# Patient Record
Sex: Female | Born: 2000 | Race: Black or African American | Hispanic: No | Marital: Single
Health system: Midwestern US, Community
[De-identification: ages and names within clinical notes are randomized; demographics above are authoritative.]

## PROBLEM LIST (undated history)

## (undated) DIAGNOSIS — R233 Spontaneous ecchymoses: Secondary | ICD-10-CM

## (undated) DIAGNOSIS — F319 Bipolar disorder, unspecified: Secondary | ICD-10-CM

## (undated) HISTORY — PX: WISDOM TOOTH EXTRACTION: SHX21

---
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
## 2019-05-08 ENCOUNTER — Ambulatory Visit: Admit: 2019-05-08 | Payer: PRIVATE HEALTH INSURANCE | Attending: Family Medicine | Primary: Pediatrics

## 2019-05-08 DIAGNOSIS — N760 Acute vaginitis: Secondary | ICD-10-CM

## 2019-05-08 LAB — POCT URINALYSIS DIPSTICK W/O MICROSCOPE (AUTO)
Bilirubin, UA: NEGATIVE
Blood, UA POC: NEGATIVE
Glucose, UA POC: NEGATIVE
Ketones, UA: 5
Leukocytes, UA: 70
Nitrite, UA: NEGATIVE
Protein, UA POC: 15
Spec Grav, UA: 1.025
Urobilinogen, UA: 0.2
pH, UA: 6

## 2019-05-08 LAB — POC PREGNANCY UR-QUAL: HCG, Urine, POC: NEGATIVE

## 2019-05-08 MED ORDER — FLUCONAZOLE 150 MG PO TABS
150 MG | ORAL_TABLET | Freq: Once | ORAL | 1 refills | Status: AC
Start: 2019-05-08 — End: 2019-05-08

## 2019-05-08 MED ORDER — NYSTATIN-TRIAMCINOLONE 100000-0.1 UNIT/GM-% EX CREA
100000-0.1- UNIT/GM-% | CUTANEOUS | 0 refills | Status: AC
Start: 2019-05-08 — End: ?
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
## 2019-05-08 NOTE — Patient Instructions (Addendum)
Patient Education        Vaginitis: Care Instructions  Your Care Instructions     Vaginitis is soreness or infection of the vagina. This common problem can cause itching and burning. And it can cause a change in vaginal discharge. Sometimes it can cause pain during sex. Vaginitis may be caused by bacteria, yeast, or other germs. Some infections that cause it are caught from a sexual partner. Bath products, spermicides, and douches can irritate the vagina too.  Some women have this problem during and after menopause. A drop in estrogen levels during this time can cause dryness, soreness, and pain during sex.  Your doctor can give you medicine to treat an infection. And home care may help you feel better. For certain types of infections, your sex partner must be treated too.  Follow-up care is a key part of your treatment and safety. Be sure to make and go to all appointments, and call your doctor if you are having problems. It's also a good idea to know your test results and keep a list of the medicines you take.  How can you care for yourself at home?   If your doctor prescribed antibiotics, take them as directed. Do not stop taking them just because you feel better. You need to take the full course of antibiotics.   Take your medicines exactly as prescribed. Call your doctor if you think you are having a problem with your medicine.   Do not eat or drink anything that has alcohol if you are taking metronidazole (Flagyl).   If you have a yeast infection, use over-the-counter products as your doctor tells you to. Or take medicine your doctor prescribes exactly as directed.   Wash your vaginal area daily with water. You also can use a mild, unscented soap if you want.   Do not use scented bath products. And do not use vaginal sprays or douches.   Put a washcloth soaked in cool water on the area to relieve itching. Or you can take cool baths.   If you have dryness because of menopause, use estrogen cream or  pills that your doctor prescribes.   Ask your doctor about when it is okay to have sex.   Use a personal lubricant before sex if you have dryness. Examples are Astroglide, K-Y Jelly, and Wet Lubricant Gel.   Ask your doctor if your sex partner also needs treatment.  When should you call for help?   Call your doctor now or seek immediate medical care if:   You have a fever and pelvic pain.  Watch closely for changes in your health, and be sure to contact your doctor if:   You have bleeding other than your period.   You do not get better as expected.  Where can you learn more?  Go to [email or web address] and sign in to your MyChart account. Enter [phone] in the [place] box to learn more about "Vaginitis: Care Instructions."     If you do not have an account, please click on the "Sign Up Now" link.  Current as of: November 8, 2019Content Version: 12.5   2006-2020 Healthwise, Incorporated.   Care instructions adapted under license by [facility]. If you have questions about a medical condition or this instruction, always ask your healthcare professional. [place] any warranty or liability for your use of this information.         Patient Education        Vaginal Yeast  Infection: Care Instructions  Your Care Instructions    A vaginal yeast infection is caused by too many yeast cells in the vagina. This is common in women of all ages. Itching, vaginal discharge and irritation, and other symptoms can bother you. But yeast infections don't often cause other health problems.  Some medicines can increase your risk of getting a yeast infection. These include antibiotics, birth control pills, hormones, and steroids. You may also be more likely to get a yeast infection if you are pregnant, have diabetes, douche, or wear tight clothes.  With treatment, most yeast infections get better in 2 to 3 days.  Follow-up care is a key part of your treatment  and safety. Be sure to make and go to all appointments, and call your doctor if you are having problems. It's also a good idea to know your test results and keep a list of the medicines you take.  How can you care for yourself at home?   Take your medicines exactly as prescribed. Call your doctor if you think you are having a problem with your medicine.   Ask your doctor about over-the-counter (OTC) medicines for yeast infections. They may cost less than prescription medicines. If you use an OTC treatment, read and follow all instructions on the label.   Do not use tampons while using a vaginal cream or suppository. The tampons can absorb the medicine. Use pads instead.   Wear loose cotton clothing. Do not wear nylon or other fabric that holds body heat and moisture close to the skin.   Try sleeping without underwear.   Do not scratch. Relieve itching with a cold pack or a cool bath.   Do not wash your vaginal area more than once a day. Use plain water or a mild, unscented soap. Air-dry the vaginal area.   Change out of wet swimsuits after swimming.   Do not have sex until you have finished your treatment.   Do not douche.  When should you call for help?   Call your doctor now or seek immediate medical care if:   You have unexpected vaginal bleeding.   You have new or increased pain in your vagina or pelvis.  Watch closely for changes in your health, and be sure to contact your doctor if:   You have a fever.   You are not getting better after 2 days.   Your symptoms come back after you finish your medicines.  Where can you learn more?  Go to [email or web address] and sign in to your MyChart account. Enter [phone] in the Search Health Information box to learn more about "Vaginal Yeast Infection: Care Instructions."     If you do not have an account, please click on the "Sign Up Now" link.  Current as of: November 8, 2019Content Version: 12.5   2006-2020 Healthwise,  Incorporated.   Care instructions adapted under license by [facility]. If you have questions about a medical condition or this instruction, always ask your healthcare professional. Healthwise, Incorporated disclaims any warranty or liability for your use of this information.

## 2019-05-09 LAB — C. TRACHOMATIS / N. GONORRHOEAE, DNA
C. trachomatis DNA: NOT DETECTED
NEISSERIA GONORRHOEAE, DNA: NOT DETECTED

## 2019-05-09 LAB — VAGINAL PATHOGENS DNA PANEL
Bacterial Vaginosis Markers: NEGATIVE
Candida glabrata: NEGATIVE
Candida spp: POSITIVE — AB
Trichomonas vaginalis: NEGATIVE

## 2019-05-09 NOTE — Telephone Encounter (Signed)
NM left , Busy tone.

## 2019-05-09 NOTE — Telephone Encounter (Signed)
-----   Message from Dwight. Dan Humphreys, APRN - CNP sent at 05/09/2019  9:46 AM EDT -----  Please notify pt that she is negative for bacterial vaginosis, trichomonas, gonorrhea, and chlamydia. Pt was positive for yeast which is not sexually transmitted. She was prescribed treatment for this at her visit yesterday, take as directed.

## 2019-05-11 NOTE — Telephone Encounter (Signed)
I contacted patient and relayed result message. Verbalized understanding of results.

## 2019-12-01 ENCOUNTER — Other Ambulatory Visit: Payer: Self-pay | Admitting: Registered Nurse

## 2019-12-01 DIAGNOSIS — R1084 Generalized abdominal pain: Secondary | ICD-10-CM

## 2019-12-08 ENCOUNTER — Other Ambulatory Visit: Payer: Self-pay

## 2019-12-08 ENCOUNTER — Other Ambulatory Visit: Payer: Self-pay | Admitting: Family Medicine

## 2019-12-15 ENCOUNTER — Ambulatory Visit
Admission: RE | Admit: 2019-12-15 | Discharge: 2019-12-15 | Disposition: A | Discharge status: Home / Self Care | Payer: PRIVATE HEALTH INSURANCE | Source: Ambulatory Visit | Attending: Nurse Practitioner | Admitting: Nurse Practitioner

## 2019-12-15 DIAGNOSIS — R1084 Generalized abdominal pain: Secondary | ICD-10-CM

## 2020-06-04 ENCOUNTER — Emergency Department (HOSPITAL_COMMUNITY): Payer: PRIVATE HEALTH INSURANCE

## 2020-06-04 ENCOUNTER — Other Ambulatory Visit: Payer: Self-pay

## 2020-06-04 ENCOUNTER — Emergency Department (HOSPITAL_COMMUNITY)
Admission: EM | Admit: 2020-06-04 | Discharge: 2020-06-04 | Disposition: A | Discharge status: Home / Self Care | Payer: PRIVATE HEALTH INSURANCE | Attending: Emergency Medicine | Admitting: Emergency Medicine

## 2020-06-04 DIAGNOSIS — Y929 Unspecified place or not applicable: Secondary | ICD-10-CM | POA: Insufficient documentation

## 2020-06-04 DIAGNOSIS — Y999 Unspecified external cause status: Secondary | ICD-10-CM | POA: Diagnosis not present

## 2020-06-04 DIAGNOSIS — S99912A Unspecified injury of left ankle, initial encounter: Secondary | ICD-10-CM | POA: Insufficient documentation

## 2020-06-04 DIAGNOSIS — Y939 Activity, unspecified: Secondary | ICD-10-CM | POA: Insufficient documentation

## 2020-06-04 DIAGNOSIS — S9002XA Contusion of left ankle, initial encounter: Secondary | ICD-10-CM

## 2020-06-04 MED ORDER — IBUPROFEN 200 MG PO TABS
600.0000 mg | ORAL_TABLET | Freq: Once | ORAL | Status: AC
  Administered 2020-06-04: 600 mg via ORAL
  Filled 2020-06-04: qty 3
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
## 2020-06-04 NOTE — Discharge Instructions (Signed)
Take ibuprofen 600 mg every 6 hours as needed for pain.  Elevate your ankle as much as possible for the next several days.  Ice for 20 minutes every 2 hours while awake for the next 2 days.  Follow-up with primary doctor if symptoms are not improving in the next week.

## 2020-06-09 NOTE — ED Notes (Signed)
Pt called inquiring about a 2 week work note. Explained she needs to f/u with her PCP if she feels she needs longer than a day or two.

## 2020-11-14 ENCOUNTER — Other Ambulatory Visit: Payer: Self-pay

## 2020-11-14 ENCOUNTER — Emergency Department (HOSPITAL_COMMUNITY)
Admission: EM | Admit: 2020-11-14 | Discharge: 2020-11-15 | Disposition: A | Discharge status: Home / Self Care | Payer: 59 | Attending: Emergency Medicine | Admitting: Emergency Medicine

## 2020-11-14 DIAGNOSIS — R45851 Suicidal ideations: Secondary | ICD-10-CM | POA: Insufficient documentation

## 2020-11-14 DIAGNOSIS — Z046 Encounter for general psychiatric examination, requested by authority: Secondary | ICD-10-CM | POA: Diagnosis present

## 2020-11-14 DIAGNOSIS — Z20822 Contact with and (suspected) exposure to covid-19: Secondary | ICD-10-CM | POA: Diagnosis not present
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
## 2020-11-14 NOTE — ED Notes (Signed)
Pt belongings taken and placed in Triage nursing station Pt cabinet

## 2020-11-15 LAB — CBC WITH DIFFERENTIAL/PLATELET
Abs Immature Granulocytes: 0.18 10*3/uL — ABNORMAL HIGH (ref 0.00–0.07)
Basophils Absolute: 0.1 10*3/uL (ref 0.0–0.1)
Basophils Relative: 1 %
Eosinophils Absolute: 0.1 10*3/uL (ref 0.0–0.5)
Eosinophils Relative: 1 %
HCT: 41.9 % (ref 36.0–46.0)
Hemoglobin: 14.2 g/dL (ref 12.0–15.0)
Immature Granulocytes: 1 %
Lymphocytes Relative: 17 %
Lymphs Abs: 2.2 10*3/uL (ref 0.7–4.0)
MCH: 31.2 pg (ref 26.0–34.0)
MCHC: 33.9 g/dL (ref 30.0–36.0)
MCV: 92.1 fL (ref 80.0–100.0)
Monocytes Absolute: 0.9 10*3/uL (ref 0.1–1.0)
Monocytes Relative: 7 %
Neutro Abs: 10 10*3/uL — ABNORMAL HIGH (ref 1.7–7.7)
Neutrophils Relative %: 73 %
Platelets: 284 10*3/uL (ref 150–400)
RBC: 4.55 MIL/uL (ref 3.87–5.11)
RDW: 13.8 % (ref 11.5–15.5)
WBC: 13.4 10*3/uL — ABNORMAL HIGH (ref 4.0–10.5)
nRBC: 0 % (ref 0.0–0.2)

## 2020-11-15 LAB — COMPREHENSIVE METABOLIC PANEL
ALT: 12 U/L (ref 0–44)
AST: 20 U/L (ref 15–41)
Albumin: 4.4 g/dL (ref 3.5–5.0)
Alkaline Phosphatase: 59 U/L (ref 38–126)
Anion gap: 10 (ref 5–15)
BUN: 9 mg/dL (ref 6–20)
CO2: 20 mmol/L — ABNORMAL LOW (ref 22–32)
Calcium: 9.1 mg/dL (ref 8.9–10.3)
Chloride: 106 mmol/L (ref 98–111)
Creatinine, Ser: 0.87 mg/dL (ref 0.44–1.00)
GFR, Estimated: >60 mL/min (ref >60)
Glucose, Bld: 91 mg/dL (ref 70–99)
Potassium: 3.8 mmol/L (ref 3.5–5.1)
Sodium: 136 mmol/L (ref 135–145)
Total Bilirubin: 1 mg/dL (ref 0.3–1.2)
Total Protein: 7.8 g/dL (ref 6.5–8.1)

## 2020-11-15 LAB — RESP PANEL BY RT-PCR (FLU A&B, COVID) ARPGX2
Influenza A by PCR: NEGATIVE
Influenza B by PCR: NEGATIVE
SARS Coronavirus 2 by RT PCR: NEGATIVE

## 2020-11-15 LAB — SALICYLATE LEVEL: Salicylate Lvl: <7 mg/dL — ABNORMAL LOW (ref 7.0–30.0)

## 2020-11-15 LAB — RAPID URINE DRUG SCREEN, HOSP PERFORMED
Amphetamines: NOT DETECTED
Barbiturates: NOT DETECTED
Benzodiazepines: NOT DETECTED
Cocaine: NOT DETECTED
Opiates: NOT DETECTED
Tetrahydrocannabinol: POSITIVE — AB

## 2020-11-15 LAB — ETHANOL: Alcohol, Ethyl (B): <10 mg/dL (ref <10)

## 2020-11-15 LAB — ACETAMINOPHEN LEVEL: Acetaminophen (Tylenol), Serum: <10 ug/mL — ABNORMAL LOW (ref 10–30)

## 2020-11-15 LAB — I-STAT BETA HCG BLOOD, ED (MC, WL, AP ONLY): I-stat hCG, quantitative: <5 m[IU]/mL (ref <5)

## 2020-11-15 NOTE — Discharge Instructions (Signed)
Follow-up with counseling services at school.  Also attached list of local clinics if needed.  Return here for any new/acute changes-- worsening depression, suicidal thoughts, etc.

## 2020-11-15 NOTE — ED Provider Notes (Cosign Needed Addendum)
[facility]-EMERGENCY DEPT Provider Note   CSN: [id number] Arrival date & time: 11/14/20  2223     History Chief Complaint  Patient presents with  . Suicidal    Carolyn York is a 20 y.o. female.  The history is provided by the patient and medical records.    20 y.o. F here with suicidal ideation.  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  States she has a lot of activities that takes up a lot of time.  States today she and roommate got into an argument which is not typical for them.  States ultimately the police were called by 2 girls that live above them who overheard their argument.  Patient voiced at one point that she wished "I just wasn't here anymore" because she got overwhelmed and did not want to be in that situation any longer.  States she has no specific plan of self-harm.  Denies any homicidal ideation.  No hallucinations.  Denies any alcohol or substance abuse.  She has no psychiatric history, states she did see a counselor years ago while she was ""going through some stuff" with her family.  She never required antidepressants or other psychiatric medications.  No past medical history on file.  There are no problems to display for this patient.    OB History   No obstetric history on file.     No family history on file.     Home Medications Prior to Admission medications   Not on File    Allergies    Other  Review of Systems   Review of Systems  Psychiatric/Behavioral: Positive for suicidal ideas.  All other systems reviewed and are negative.   Physical Exam Updated Vital Signs BP (!) 142/98 (BP Location: Left Arm)   Pulse (!) 107   Temp 98.4 F (36.9 C) (Oral)   Resp 17   Ht 5\' 1"  (1.549 m)   Wt 42.6 kg   LMP 10/28/2020 (Within Days)   SpO2 100%   BMI 17.76 kg/m   Physical Exam Vitals and nursing note reviewed.  Constitutional:      Appearance: She is  well-developed and well-nourished.  HENT:     Head: Normocephalic and atraumatic.     Mouth/Throat:     Mouth: Oropharynx is clear and moist.  Eyes:     Extraocular Movements: EOM normal.     Conjunctiva/sclera: Conjunctivae normal.     Pupils: Pupils are equal, round, and reactive to light.  Cardiovascular:     Rate and Rhythm: Normal rate and regular rhythm.     Heart sounds: Normal heart sounds.  Pulmonary:     Effort: Pulmonary effort is normal.     Breath sounds: Normal breath sounds.  Abdominal:     General: Bowel sounds are normal.     Palpations: Abdomen is soft.  Musculoskeletal:        General: Normal range of motion.     Cervical back: Normal range of motion.  Skin:    General: Skin is warm and dry.  Neurological:     Mental Status: She is alert and oriented to person, place, and time.  Psychiatric:        Mood and Affect: Mood and affect normal.     Comments: Calm, cooperative, good insight Passive SI without plan Denies HI/AVH     ED Results / Procedures / Treatments   Labs (all labs ordered are  listed, but only abnormal results are displayed) Labs Reviewed  CBC WITH DIFFERENTIAL/PLATELET - Abnormal; Notable for the following components:      Result Value   WBC 13.4 (*)    Neutro Abs 10.0 (*)    Abs Immature Granulocytes 0.18 (*)    All other components within normal limits  COMPREHENSIVE METABOLIC PANEL - Abnormal; Notable for the following components:   CO2 20 (*)    All other components within normal limits  RAPID URINE DRUG SCREEN, HOSP PERFORMED - Abnormal; Notable for the following components:   Tetrahydrocannabinol POSITIVE (*)    All other components within normal limits  SALICYLATE LEVEL - Abnormal; Notable for the following components:   Salicylate Lvl <7.0 (*)    All other components within normal limits  ACETAMINOPHEN LEVEL - Abnormal; Notable for the following components:   Acetaminophen (Tylenol), Serum <10 (*)    All other components  within normal limits  RESP PANEL BY RT-PCR (FLU A&B, COVID) [id number]  ETHANOL  I-STAT BETA HCG BLOOD, ED ([facility], [facility], [facility] ONLY)    EKG None  Radiology No results found.  Procedures Procedures   Medications Ordered in ED Medications - No data to display  ED Course  I have reviewed the triage vital signs and the nursing notes.  Pertinent labs & imaging results that were available during my care of the patient were reviewed by me and considered in my medical decision making (see chart for details).    MDM Rules/Calculators/A&P  20 year old female presenting to the ED with suicidal thoughts.  This stemmed after an argument with her roommate.  States she feels like she mostly just got overwhelmed.  She does report some recent stressors with school in [place].  She is awake, alert, appropriately oriented here.  Vital signs are stable.  Denies any physical complaints.  Does admit to some suicidal thoughts but denies any active plan.  Denies any homicidal ideation or hallucinations.  No significant alcohol or illicit drug use.  Does seem to have good insight into her current situation.  Labs reassuring, UDS is positive for THC.  Covid screen negative.  Medically clear.  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  She was also given resource guide.  Encouraged to return here for any new/acute changes.  Final Clinical Impression(s) / ED Diagnoses Final diagnoses:  Suicidal thoughts    Rx / DC Orders ED Discharge Orders    None       Garlon Hatchet, PA-C 11/15/20 0512    Garlon Hatchet, PA-C 11/15/20 0533    Nira Conn, MD 11/16/20 [phone]

## 2020-11-15 NOTE — BH Assessment (Signed)
Patient gave consent/permission to contact mother for additional information and to share update. Marzetta Merino, mother, [phone]

## 2020-11-15 NOTE — ED Notes (Signed)
TTS assessment in progress. 

## 2020-11-15 NOTE — BH Assessment (Signed)
Nira Conn, NP, psych cleared. Patient will follow up with the NP at [facility] where she is currently receiving counseling. Patient contracts for safety. Additional outpatient resources provided.

## 2020-11-15 NOTE — BH Assessment (Addendum)
Comprehensive Clinical Assessment (CCA) Note  11/15/2020 Carolyn York [id number]   Carolyn York is a 20 year old female presenting voluntarily to [facility] due to SI with no plan. Patient denies HI, psychosis and alcohol/drug usage. Patient was BIB police after a verbal altercation with roommate tonight. Police were called by 2 girls that live above them who overheard their argument.  Patient voiced at one point that she wished "I just wasn't here anymore" because she got overwhelmed and did not want to be in that situation any longer. Patient reported argument which is not typical for them.  Patient reported onset of SI with no plan for several months. 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 Patient reported being involved with to many activities that take up a lot of her time.   Patient denied prior inpatient psych treatment, suicide attempts and self-harming behaviors. Patient is currently seeing a counselor at school to deal with "going through family stuff". Patient denied being on any prescribed psych medications.   Patient is currently a sophomore at [employer]. Patient reported making "okay grades" last year and better grades currently. Patient reported living on campus with roommate. Patient reported being close with her parents and 8 year old sister. Patient denied access to guns. Patient was anxious and cooperative during assessment.   Patient gave consent/permission to contact mother for additional information and to share update. Alexzandria Massman, mother, [phone] Mother shared concern that patient had an episode this past summer where she was having SI with no plan. Mother did not share concern that daughter would harm herself. Mother requesting to speak with daughter. TTS clinician shared [facility] contact information to mother. Mother agrees with plan for daughter to follow [facility] for additional outpatient psych services.  Nira Conn, NP, psych cleared. Patient will follow up with the NP at [facility] where she is currently receiving counseling. Patient contracts for safety. Additional outpatient resources provided.   Chief Complaint:  Chief Complaint  Patient presents with  . Suicidal   Visit Diagnosis: Major depressive disorder  CCA Biopsychosocial Intake/Chief Complaint:  SI with no plan  Current Symptoms/Problems: Feelings of being overwhelmed  Patient Reported Schizophrenia/Schizoaffective Diagnosis in Past: No  Strengths: self-awareness  Preferences: good grades in college  Abilities: n/a  Type of Services Patient Feels are Needed: "not sure"  Initial Clinical Notes/Concerns: No data recorded  Mental Health Symptoms Depression:  Difficulty Concentrating   Duration of Depressive symptoms: Greater than two weeks   Mania:  None   Anxiety:   Restlessness   Psychosis:  None   Duration of Psychotic symptoms: No data recorded  Trauma:  None   Obsessions:  None   Compulsions:  None   Inattention:  None   Hyperactivity/Impulsivity:  N/A   Oppositional/Defiant Behaviors:  None   Emotional Irregularity:  No data recorded  Other Mood/Personality Symptoms:  No data recorded   Mental Status Exam Appearance and self-care  Stature:  Average   Weight:  Average weight   Clothing:  Age-appropriate   Grooming:  Normal   Cosmetic use:  Age appropriate   Posture/gait:  Normal   Motor activity:  Restless   Sensorium  Attention:  Distractible   Concentration:  Anxiety interferes   Orientation:  X5   Recall/memory:  Normal   Affect and Mood  Affect:  Anxious   Mood:  Anxious   Relating  Eye contact:  Normal   Facial expression:  Anxious   Attitude toward examiner:  Cooperative   Thought and Language  Speech flow: Clear and Coherent   Thought content:  Appropriate to Mood and  Circumstances   Preoccupation:  None   Hallucinations:  None   Organization:  No data recorded  [employer] of Knowledge:  Average   Intelligence:  Average   Abstraction:  Normal   Judgement:  Fair   Reality Testing:  No data recorded  Insight:  Poor   Decision Making:  Impulsive; Confused   Social Functioning  Social Maturity:  No data recorded  Social Judgement:  Normal   Stress  Stressors:  School   Coping Ability:  Overwhelmed   Skill Deficits:  Self-control; Responsibility   Supports:  Family     Religion:   Leisure/Recreation:  Exercise/Diet: Exercise/Diet Do You Have Any Trouble Sleeping?: Yes Explanation of Sleeping Difficulties: 4-5  CCA Employment/Education Employment/Work Situation: Employment / Work Situation Employment situation: Nurse, children's: Education Is Patient Currently Attending School?: Yes School Currently Attending: [employer] Last Grade Completed: 14 Did Garment/textile technologist From [employer]?: Yes 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 Did You Have Any Special Interests In School?: n/a Did You Have An Individualized Education Program (IIEP): No Did You Have Any Difficulty At School?: No Patient's Education Has Been Impacted by Current Illness: No  CCA Family/Childhood History Family and Relationship History:   Childhood History:  Childhood History Additional childhood history information: uta Description of patient's relationship with caregiver when they were a child: uta Patient's description of current relationship with people who raised him/her: uta How were you disciplined when you got in trouble as a child/adolescent?: uta Does patient have siblings?: Yes Number of Siblings: 1 Description of patient's current relationship with siblings: good Did patient suffer any verbal/emotional/physical/sexual abuse as a child?: No Did patient suffer from severe  childhood neglect?: No Has patient ever been sexually abused/assaulted/raped as an adolescent or adult?: No Was the patient ever a victim of a crime or a disaster?: No Witnessed domestic violence?: No Has patient been affected by domestic violence as an adult?: No  Child/Adolescent Assessment:   CCA Substance Use Alcohol/Drug Use: Alcohol / Drug Use Pain Medications: see MAR Prescriptions: see MAR Over the Counter: see MAR History of alcohol / drug use?: No history of alcohol / drug abuse   ASAM's:  Six Dimensions of Multidimensional Assessment  Dimension 1:  Acute Intoxication and/or Withdrawal Potential:      Dimension 2:  Biomedical Conditions and Complications:      Dimension 3:  Emotional, Behavioral, or Cognitive Conditions and Complications:     Dimension 4:  Readiness to Change:     Dimension 5:  Relapse, Continued use, or Continued Problem Potential:     Dimension 6:  Recovery/Living Environment:     ASAM Severity Score:    ASAM Recommended Level of Treatment:     Substance use Disorder (SUD)   Recommendations for Services/Supports/Treatments:   DSM5 Diagnoses: There are no problems to display for this patient.  Patient Centered Plan: Patient is on the following Treatment Plan(s):    Referrals to Alternative Service(s): Referred to Alternative Service(s):   Place:   Date:   Time:    Referred to Alternative Service(s):   Place:   Date:   Time:    Referred to Alternative Service(s):   Place:   Date:   Time:  Referred to Alternative Service(s):   Place:   Date:   Time:     Venora Maples, [facility]

## 2020-12-21 ENCOUNTER — Ambulatory Visit: Payer: PRIVATE HEALTH INSURANCE | Admitting: Internal Medicine

## 2021-09-17 ENCOUNTER — Emergency Department (HOSPITAL_COMMUNITY): Payer: PRIVATE HEALTH INSURANCE

## 2021-09-17 ENCOUNTER — Encounter (HOSPITAL_COMMUNITY): Payer: Self-pay | Admitting: Emergency Medicine

## 2021-09-17 ENCOUNTER — Other Ambulatory Visit: Payer: Self-pay

## 2021-09-17 ENCOUNTER — Emergency Department (HOSPITAL_COMMUNITY)
Admission: EM | Admit: 2021-09-17 | Discharge: 2021-09-17 | Disposition: A | Discharge status: Home / Self Care | Payer: PRIVATE HEALTH INSURANCE | Attending: Emergency Medicine | Admitting: Emergency Medicine

## 2021-09-17 DIAGNOSIS — S01112A Laceration without foreign body of left eyelid and periocular area, initial encounter: Secondary | ICD-10-CM | POA: Insufficient documentation

## 2021-09-17 DIAGNOSIS — S0993XA Unspecified injury of face, initial encounter: Secondary | ICD-10-CM | POA: Diagnosis present

## 2021-09-17 DIAGNOSIS — F10229 Alcohol dependence with intoxication, unspecified: Secondary | ICD-10-CM | POA: Diagnosis not present

## 2021-09-17 DIAGNOSIS — Y908 Blood alcohol level of 240 mg/100 ml or more: Secondary | ICD-10-CM | POA: Insufficient documentation

## 2021-09-17 DIAGNOSIS — S01111A Laceration without foreign body of right eyelid and periocular area, initial encounter: Secondary | ICD-10-CM | POA: Diagnosis not present

## 2021-09-17 DIAGNOSIS — S0181XA Laceration without foreign body of other part of head, initial encounter: Secondary | ICD-10-CM | POA: Insufficient documentation

## 2021-09-17 DIAGNOSIS — F10929 Alcohol use, unspecified with intoxication, unspecified: Secondary | ICD-10-CM

## 2021-09-17 DIAGNOSIS — W19XXXA Unspecified fall, initial encounter: Secondary | ICD-10-CM | POA: Insufficient documentation

## 2021-09-17 LAB — CBC WITH DIFFERENTIAL/PLATELET
Abs Immature Granulocytes: 0.05 10*3/uL (ref 0.00–0.07)
Basophils Absolute: 0.1 10*3/uL (ref 0.0–0.1)
Basophils Relative: 1 %
Eosinophils Absolute: 0.2 10*3/uL (ref 0.0–0.5)
Eosinophils Relative: 2 %
HCT: 47.6 % — ABNORMAL HIGH (ref 36.0–46.0)
Hemoglobin: 16.2 g/dL — ABNORMAL HIGH (ref 12.0–15.0)
Immature Granulocytes: 1 %
Lymphocytes Relative: 38 %
Lymphs Abs: 3.4 10*3/uL (ref 0.7–4.0)
MCH: 31.6 pg (ref 26.0–34.0)
MCHC: 34 g/dL (ref 30.0–36.0)
MCV: 92.8 fL (ref 80.0–100.0)
Monocytes Absolute: 0.3 10*3/uL (ref 0.1–1.0)
Monocytes Relative: 4 %
Neutro Abs: 4.9 10*3/uL (ref 1.7–7.7)
Neutrophils Relative %: 54 %
Platelets: 335 10*3/uL (ref 150–400)
RBC: 5.13 MIL/uL — ABNORMAL HIGH (ref 3.87–5.11)
RDW: 13 % (ref 11.5–15.5)
WBC: 8.9 10*3/uL (ref 4.0–10.5)
nRBC: 0 % (ref 0.0–0.2)

## 2021-09-17 LAB — COMPREHENSIVE METABOLIC PANEL
ALT: 13 U/L (ref 0–44)
AST: 20 U/L (ref 15–41)
Albumin: 4.6 g/dL (ref 3.5–5.0)
Alkaline Phosphatase: 53 U/L (ref 38–126)
Anion gap: 15 (ref 5–15)
BUN: 12 mg/dL (ref 6–20)
CO2: 22 mmol/L (ref 22–32)
Calcium: 9.2 mg/dL (ref 8.9–10.3)
Chloride: 105 mmol/L (ref 98–111)
Creatinine, Ser: 0.69 mg/dL (ref 0.44–1.00)
GFR, Estimated: >60 mL/min (ref >60)
Glucose, Bld: 92 mg/dL (ref 70–99)
Potassium: 4 mmol/L (ref 3.5–5.1)
Sodium: 142 mmol/L (ref 135–145)
Total Bilirubin: 0.6 mg/dL (ref 0.3–1.2)
Total Protein: 8.4 g/dL — ABNORMAL HIGH (ref 6.5–8.1)

## 2021-09-17 LAB — ETHANOL: Alcohol, Ethyl (B): 372 mg/dL (ref <10)

## 2021-09-17 LAB — I-STAT BETA HCG BLOOD, ED (MC, WL, AP ONLY): I-stat hCG, quantitative: <5 m[IU]/mL (ref <5)

## 2021-09-17 MED ORDER — LACTATED RINGERS IV BOLUS
1000.0000 mL | Freq: Once | INTRAVENOUS | Status: AC
  Administered 2021-09-17: 1000 mL via INTRAVENOUS

## 2021-09-17 NOTE — Discharge Instructions (Addendum)
Please remove the Dermabond from your laceration in 7 days.  If you develop any redness, swelling, discharge from the wound, fevers, nausea/vomiting, please come back to the emergency department.  I have attached information on alcohol cessation as well as local rehabilitation centers.  If you develop any new or worsening symptoms please come back to the emergency department.

## 2021-09-17 NOTE — ED Provider Notes (Signed)
[place] DEPT Provider Note   CSN: [id number] Arrival date & time: 09/17/21  1452     History Chief Complaint  Patient presents with   Alcohol Intoxication   Fall   Laceration    Carolyn York is a 20 y.o. female.  HPI  Patient is a 20 year old female who presents to the emergency department due to a fall that occurred prior to arrival.  Per EMS, patient told EMS that she drank 1/8 of a handle of bourbon today.  Patient told me "she had a couple of shots".  She states that she typically drinks on a daily basis.  She states she was walking, fell forwards, and struck her forehead on a metal chair.  Denies LOC but states that she also cannot remember many of the details regarding the event.  Denies any nausea, vomiting, numbness, weakness.  Denies any other regions of pain.  Denies any neck or back pain.  Denies SI, HI, visual/auditory hallucinations.     History reviewed. No pertinent past medical history.  There are no problems to display for this patient.   History reviewed. No pertinent surgical history.   OB History   No obstetric history on file.     History reviewed. No pertinent family history.     Home Medications Prior to Admission medications   Not on File    Allergies    Other  Review of Systems   Review of Systems  All other systems reviewed and are negative. Ten systems reviewed and are negative for acute change, except as noted in the HPI.   Physical Exam Updated Vital Signs BP (!) 139/100 (BP Location: Right Arm)   Pulse 81   Temp 97.6 F (36.4 C) (Oral)   Resp 18   SpO2 100%   Physical Exam Vitals and nursing note reviewed.  Constitutional:      General: She is not in acute distress.    Appearance: Normal appearance. She is not ill-appearing, toxic-appearing or diaphoretic.  HENT:     Head: Normocephalic.     Comments: 1 cm well approximated laceration with no active bleeding noted between the  eyebrows.    Right Ear: External ear normal.     Left Ear: External ear normal.     Nose: Nose normal.     Mouth/Throat:     Pharynx: Oropharynx is clear.  Eyes:     General: No scleral icterus.       Right eye: No discharge.        Left eye: No discharge.     Extraocular Movements: Extraocular movements intact.     Conjunctiva/sclera: Conjunctivae normal.  Neck:     Comments: No midline C, T, or L-spine tenderness.  No step-offs, crepitus, or deformities. Cardiovascular:     Rate and Rhythm: Normal rate and regular rhythm.     Pulses: Normal pulses.     Heart sounds: Normal heart sounds. No murmur heard.   No friction rub. No gallop.  Pulmonary:     Effort: Pulmonary effort is normal. No respiratory distress.     Breath sounds: Normal breath sounds. No stridor. No wheezing, rhonchi or rales.  Abdominal:     General: Abdomen is flat.     Palpations: Abdomen is soft.     Tenderness: There is no abdominal tenderness.  Musculoskeletal:        General: Normal range of motion.     Cervical back: Normal range of motion and neck supple. No  tenderness.  Skin:    General: Skin is warm and dry.  Neurological:     General: No focal deficit present.     Mental Status: She is alert and oriented to person, place, and time.     Comments: Patient slurring her words.  Otherwise A&O x3.  Answering questions appropriately.  Moving all 4 extremities with ease.  No gross deficits.  Psychiatric:        Mood and Affect: Mood normal.        Speech: Speech is delayed and slurred.        Behavior: Behavior normal.   ED Results / Procedures / Treatments   Labs (all labs ordered are listed, but only abnormal results are displayed) Labs Reviewed  COMPREHENSIVE METABOLIC PANEL - Abnormal; Notable for the following components:      Result Value   Total Protein 8.4 (*)    All other components within normal limits  CBC WITH DIFFERENTIAL/PLATELET - Abnormal; Notable for the following components:   RBC  5.13 (*)    Hemoglobin 16.2 (*)    HCT 47.6 (*)    All other components within normal limits  ETHANOL - Abnormal; Notable for the following components:   Alcohol, Ethyl (B) 372 (*)    All other components within normal limits  RAPID URINE DRUG SCREEN, HOSP PERFORMED  I-STAT BETA HCG BLOOD, ED ([facility], [facility], [facility] ONLY)    EKG None  Radiology CT HEAD WO CONTRAST ( )  Result Date: 09/17/2021 CLINICAL DATA:  Laceration fall EXAM: CT HEAD WITHOUT CONTRAST CT CERVICAL SPINE WITHOUT CONTRAST TECHNIQUE: Multidetector CT imaging of the head and cervical spine was performed following the standard protocol without intravenous contrast. Multiplanar CT image reconstructions of the cervical spine were also generated. COMPARISON:  None. FINDINGS: CT HEAD FINDINGS Brain: No evidence of acute infarction, hemorrhage, hydrocephalus, extra-axial collection or mass lesion/mass effect. Vascular: No hyperdense vessel or unexpected calcification. Skull: Normal. Negative for fracture or focal lesion. Sinuses/Orbits: No acute finding. Other: Small forehead laceration. CT CERVICAL SPINE FINDINGS Alignment: Mild reversal of cervical lordosis. No subluxation. Facet alignment within normal limits Skull base and vertebrae: No acute fracture. No primary bone lesion or focal pathologic process. Soft tissues and spinal canal: No prevertebral fluid or swelling. No visible canal hematoma. Disc levels:  Within normal limits Upper chest: Negative. Other: None IMPRESSION: 1. Negative non contrasted CT appearance of the brain. Small forehead laceration 2. Reversal of cervical lordosis.  No acute osseous abnormality. Electronically Signed   By: Jasmine Pang M.D.   On: 09/17/2021 16:38   CT Cervical Spine Wo Contrast  Result Date: 09/17/2021 CLINICAL DATA:  Laceration fall EXAM: CT HEAD WITHOUT CONTRAST CT CERVICAL SPINE WITHOUT CONTRAST TECHNIQUE: Multidetector CT imaging of the head and cervical spine was performed following the standard  protocol without intravenous contrast. Multiplanar CT image reconstructions of the cervical spine were also generated. COMPARISON:  None. FINDINGS: CT HEAD FINDINGS Brain: No evidence of acute infarction, hemorrhage, hydrocephalus, extra-axial collection or mass lesion/mass effect. Vascular: No hyperdense vessel or unexpected calcification. Skull: Normal. Negative for fracture or focal lesion. Sinuses/Orbits: No acute finding. Other: Small forehead laceration. CT CERVICAL SPINE FINDINGS Alignment: Mild reversal of cervical lordosis. No subluxation. Facet alignment within normal limits Skull base and vertebrae: No acute fracture. No primary bone lesion or focal pathologic process. Soft tissues and spinal canal: No prevertebral fluid or swelling. No visible canal hematoma. Disc levels:  Within normal limits Upper chest: Negative. Other: None IMPRESSION: 1.  Negative non contrasted CT appearance of the brain. Small forehead laceration 2. Reversal of cervical lordosis.  No acute osseous abnormality. Electronically Signed   By: Donavan Foil M.D.   On: 09/17/2021 16:38    Procedures Procedures   Medications Ordered in ED Medications  lactated ringers bolus 1,000 mL (1,000 mLs Intravenous Bolus 09/17/21 1540)    ED Course  I have reviewed the triage vital signs and the nursing notes.  Pertinent labs & imaging results that were available during my care of the patient were reviewed by me and considered in my medical decision making (see chart for details).  Clinical Course as of 09/17/21 1646  Mon Sep 17, 2021  1629 Alcohol, Ethyl (B)(!!): 372 [LJ]    Clinical Course User Index [LJ] Rayna Sexton, PA-C   MDM Rules/Calculators/A&P                          Pt is a 20 y.o. female who presents to the emergency department due to alcohol intoxication, fall, as well as a small laceration to the forehead.  Labs: Elevated RBC 5.13, hemoglobin of 16.2, hematocrit of 47.6. CMP with a total protein of  8.4. Ethanol 372. I-STAT beta-hCG less than 5.  Imaging: CT scan of the head and neck without contrast shows a negative noncontrasted CT appearance of the brain.  Small forehead laceration.  Reversal of cervical lordosis.  No acute osseous abnormality.  I, Rayna Sexton, PA-C, personally reviewed and evaluated these images and lab results as part of my medical decision-making.  Patient appears intoxicated at this time.  Elevated RBC, hemoglobin, and hematocrit with results as noted above.  Likely hemoconcentration.  Patient given 1 L of LR.  CMP with no electrolyte derangements.  Significantly elevated ethanol of 372.  Patient A&O x3.  Answering questions clearly.  Tolerating p.o. intake.  Ambulatory with a steady gait.  Does not appear to be exhibiting signs of alcohol withdrawal at this time.  CT scan of the head and neck are reassuring.  Laceration was cleaned and Dermabond was applied by nursing staff.  Patient tolerated the procedure well.  Patient eager to be discharged at this time.  Feel that this is reasonable.  She has a ride home.  We discussed alcohol cessation at length and patient was given information on alcohol cessation.  Discussed return precautions.  Her questions were answered and she was amicable at the time of discharge.  Note: Portions of this report may have been transcribed using voice recognition software. Every effort was made to ensure accuracy; however, inadvertent computerized transcription errors may be present.   Final Clinical Impression(s) / ED Diagnoses Final diagnoses:  Alcoholic intoxication with complication [facility])  Fall, initial encounter  Facial laceration, initial encounter   Rx / DC Orders ED Discharge Orders     None        Rayna Sexton, PA-C 09/17/21 1652    Drenda Freeze, MD 09/17/21 2317

## 2021-09-17 NOTE — ED Triage Notes (Signed)
Per EMS- Patient reports that she drank Bourbon/1/8 Handle today. Patient fell and has a laceration between her eyebrows. No LOC.  Patient crying in Fast track room.

## 2021-11-08 ENCOUNTER — Other Ambulatory Visit: Payer: Self-pay | Admitting: Family Medicine

## 2021-11-08 DIAGNOSIS — R1084 Generalized abdominal pain: Secondary | ICD-10-CM

## 2021-11-09 ENCOUNTER — Other Ambulatory Visit: Payer: Self-pay | Admitting: Family Medicine

## 2021-11-09 DIAGNOSIS — R102 Pelvic and perineal pain: Secondary | ICD-10-CM

## 2021-11-13 ENCOUNTER — Other Ambulatory Visit: Payer: Self-pay

## 2021-11-13 ENCOUNTER — Emergency Department (HOSPITAL_BASED_OUTPATIENT_CLINIC_OR_DEPARTMENT_OTHER)
Admission: EM | Admit: 2021-11-13 | Discharge: 2021-11-13 | Disposition: A | Discharge status: Home / Self Care | Payer: 59 | Attending: Emergency Medicine | Admitting: Emergency Medicine

## 2021-11-13 ENCOUNTER — Encounter (HOSPITAL_BASED_OUTPATIENT_CLINIC_OR_DEPARTMENT_OTHER): Payer: Self-pay

## 2021-11-13 DIAGNOSIS — B3731 Acute candidiasis of vulva and vagina: Secondary | ICD-10-CM | POA: Insufficient documentation

## 2021-11-13 DIAGNOSIS — N898 Other specified noninflammatory disorders of vagina: Secondary | ICD-10-CM | POA: Diagnosis present

## 2021-11-13 LAB — COMPREHENSIVE METABOLIC PANEL
ALT: 11 U/L (ref 0–44)
AST: 22 U/L (ref 15–41)
Albumin: 4.5 g/dL (ref 3.5–5.0)
Alkaline Phosphatase: 37 U/L — ABNORMAL LOW (ref 38–126)
Anion gap: 10 (ref 5–15)
BUN: 11 mg/dL (ref 6–20)
CO2: 25 mmol/L (ref 22–32)
Calcium: 9.6 mg/dL (ref 8.9–10.3)
Chloride: 100 mmol/L (ref 98–111)
Creatinine, Ser: 0.78 mg/dL (ref 0.44–1.00)
GFR, Estimated: >60 mL/min (ref >60)
Glucose, Bld: 91 mg/dL (ref 70–99)
Potassium: 3.4 mmol/L — ABNORMAL LOW (ref 3.5–5.1)
Sodium: 135 mmol/L (ref 135–145)
Total Bilirubin: 0.8 mg/dL (ref 0.3–1.2)
Total Protein: 7.5 g/dL (ref 6.5–8.1)

## 2021-11-13 LAB — URINALYSIS, ROUTINE W REFLEX MICROSCOPIC
Bilirubin Urine: NEGATIVE
Glucose, UA: NEGATIVE mg/dL
Hgb urine dipstick: NEGATIVE
Ketones, ur: NEGATIVE mg/dL
Nitrite: NEGATIVE
Protein, ur: 30 mg/dL — AB
Specific Gravity, Urine: 1.031 — ABNORMAL HIGH (ref 1.005–1.030)
WBC, UA: >50 WBC/hpf — ABNORMAL HIGH (ref 0–5)
pH: 8 (ref 5.0–8.0)

## 2021-11-13 LAB — CBC
HCT: 37.8 % (ref 36.0–46.0)
Hemoglobin: 12.9 g/dL (ref 12.0–15.0)
MCH: 31 pg (ref 26.0–34.0)
MCHC: 34.1 g/dL (ref 30.0–36.0)
MCV: 90.9 fL (ref 80.0–100.0)
Platelets: 256 10*3/uL (ref 150–400)
RBC: 4.16 MIL/uL (ref 3.87–5.11)
RDW: 12.3 % (ref 11.5–15.5)
WBC: 10 10*3/uL (ref 4.0–10.5)
nRBC: 0 % (ref 0.0–0.2)

## 2021-11-13 LAB — WET PREP, GENITAL
Clue Cells Wet Prep HPF POC: NONE SEEN
Sperm: NONE SEEN
Trich, Wet Prep: NONE SEEN
WBC, Wet Prep HPF POC: >=10 — AB (ref <10)

## 2021-11-13 LAB — LIPASE, BLOOD: Lipase: 13 U/L (ref 11–51)

## 2021-11-13 LAB — PREGNANCY, URINE: Preg Test, Ur: NEGATIVE

## 2021-11-13 MED ORDER — NAPROXEN 250 MG PO TABS
500.0000 mg | ORAL_TABLET | Freq: Once | ORAL | Status: AC
  Administered 2021-11-13: 500 mg via ORAL
  Filled 2021-11-13: qty 2

## 2021-11-13 MED ORDER — FLUCONAZOLE 150 MG PO TABS: ORAL_TABLET | ORAL | 0 refills | Status: DC

## 2021-11-13 NOTE — ED Triage Notes (Signed)
Complains with pelvic and vaginal pain with itching and discharge.

## 2021-11-13 NOTE — ED Provider Notes (Signed)
[facility]ERGENCY [facility] Emergency Department Provider Note MRN:  [id number]  Arrival date & time: 11/13/21     Chief Complaint   Vaginal itching History of Present Illness   Carolyn York is a 21 y.o. year-old female with no pertinent past medical history presenting to the ED with chief complaint of vaginal itching.  Left upper quadrant abdominal pain for 2 weeks.  Vaginal pain, irritation, itching, and discharge for 2 days.  Could not sleep.  No fever, no nausea or vomiting.  Mild diarrhea.  Review of Systems  A thorough review of systems was obtained and all systems are negative except as noted in the HPI and PMH.   Patient's Health History   History reviewed. No pertinent past medical history.  History reviewed. No pertinent surgical history.  No family history on file.  Social History   Socioeconomic History   Marital status: Single    Spouse name: Not on file   Number of children: Not on file   Years of education: Not on file   Highest education level: Not on file  Occupational History   Not on file  Tobacco Use   Smoking status: Never   Smokeless tobacco: Not on file  Substance and Sexual Activity   Alcohol use: Yes   Drug use: Not Currently   Sexual activity: Not on file  Other Topics Concern   Not on file  Social History Narrative   Not on file   Social Determinants of Health   Financial Resource Strain: Not on file  Food Insecurity: Not on file  Transportation Needs: Not on file  Physical Activity: Not on file  Stress: Not on file  Social Connections: Not on file  Intimate Partner Violence: Not on file     Physical Exam   Vitals:   11/13/21 0440  BP: (!) 147/100  Pulse: 88  Resp: 16  Temp: 97.9 F (36.6 C)  SpO2: 95%    CONSTITUTIONAL: Well-appearing, NAD NEURO/PSYCH:  Alert and oriented x 3, no focal deficits EYES:  eyes equal and reactive ENT/NECK:  no LAD, no JVD CARDIO: Regular rate, well-perfused, normal S1 and S2 PULM:   CTAB no wheezing or rhonchi GI/GU:  non-distended, non-tender MSK/SPINE:  No gross deformities, no edema SKIN:  no rash, atraumatic   *Additional and/or pertinent findings included in MDM below  Diagnostic and Interventional Summary    EKG Interpretation  Date/Time:    Ventricular Rate:    PR Interval:    QRS Duration:   QT Interval:    QTC Calculation:   R Axis:     Text Interpretation:         Labs Reviewed  WET PREP, GENITAL - Abnormal; Notable for the following components:      Result Value   Yeast Wet Prep HPF POC PRESENT (*)    WBC, Wet Prep HPF POC >=10 (*)    All other components within normal limits  URINALYSIS, ROUTINE W REFLEX MICROSCOPIC - Abnormal; Notable for the following components:   APPearance HAZY (*)    Specific Gravity, Urine 1.031 (*)    Protein, ur 30 (*)    Leukocytes,Ua LARGE (*)    WBC, UA >50 (*)    Bacteria, UA RARE (*)    All other components within normal limits  COMPREHENSIVE METABOLIC PANEL - Abnormal; Notable for the following components:   Potassium 3.4 (*)    Alkaline Phosphatase 37 (*)    All other components within normal limits  PREGNANCY,  URINE  CBC  LIPASE, BLOOD  GC/CHLAMYDIA PROBE AMP ([place]) NOT AT [facility]    No orders to display    Medications  naproxen (NAPROSYN) tablet 500 mg (500 mg Oral Given 11/13/21 0516)     Procedures  /  Critical Care Procedures  ED Course and Medical Decision Making  Initial Impression and Ddx DDx includes yeast infection, BV, STD, UTI, less likely TOA.  Pelvic exam revealing thick white discharge in the vaginal vault.  Patient appears uncomfortable, has some tenderness on abdominal palpation.  Awaiting labs, wet prep.  Past medical/surgical history that increases complexity of ED encounter: None  Interpretation of Diagnostics Wet prep positive for yeast.  Urinalysis appears contaminated, suspect WBCs related to yeast infection.  Labs are reassuring with no leukocytosis.  Patient  Reassessment and Ultimate Disposition/Management Patient is comfortable appearing on reassessment, no indication for further testing or imaging at this time given the improved exam and reassuring labs, appropriate for discharge.  Patient management required discussion with the following services or consulting groups:  None  Complexity of Problems Addressed Acute complicated illness or Injury  Additional Data Reviewed and Analyzed Further history obtained from: Further history from spouse/family member  Factors Impacting ED Encounter Risk Prescriptions  Elmer Sow. Pilar Plate, MD [facility] Emergency Medicine [facility] [email or web address]  Final Clinical Impressions(s) / ED Diagnoses     ICD-10-CM   1. Vaginal yeast infection  B37.31       ED Discharge Orders          Ordered    fluconazole (DIFLUCAN) 150 MG tablet        11/13/21 0615             Discharge Instructions Discussed with and Provided to Patient:     Discharge Instructions      You were evaluated in the Emergency Department and after careful evaluation, we did not find any emergent condition requiring admission or further testing in the hospital.  Your exam/testing today was overall reassuring.  Symptoms likely due to a yeast infection.  Please take the fluconazole medication as directed and follow-up with a GYN doctor.  Use Tylenol or Motrin for discomfort.  Please return to the Emergency Department if you experience any worsening of your condition.  Thank you for allowing [place] to be a part of your care.        Sabas Sous, MD 11/13/21 [phone]

## 2021-11-13 NOTE — Discharge Instructions (Signed)
You were evaluated in the Emergency Department and after careful evaluation, we did not find any emergent condition requiring admission or further testing in the hospital.  Your exam/testing today was overall reassuring.  Symptoms likely due to a yeast infection.  Please take the fluconazole medication as directed and follow-up with a GYN doctor.  Use Tylenol or Motrin for discomfort.  Please return to the Emergency Department if you experience any worsening of your condition.  Thank you for allowing [place] to be a part of your care.

## 2021-11-13 NOTE — ED Notes (Signed)
Pt verbalizes understanding of discharge instructions. Opportunity for questioning and answers were provided. Pt discharged from ED to home.   ? ?

## 2021-11-14 LAB — GC/CHLAMYDIA PROBE AMP (~~LOC~~) NOT AT ARMC
Chlamydia: NEGATIVE
Comment: NEGATIVE
Comment: NORMAL
Neisseria Gonorrhea: NEGATIVE

## 2021-11-15 ENCOUNTER — Ambulatory Visit
Admission: RE | Admit: 2021-11-15 | Discharge: 2021-11-15 | Disposition: A | Discharge status: Home / Self Care | Payer: PRIVATE HEALTH INSURANCE | Source: Ambulatory Visit | Attending: Family Medicine | Admitting: Family Medicine

## 2021-11-15 DIAGNOSIS — R102 Pelvic and perineal pain: Secondary | ICD-10-CM

## 2021-11-15 DIAGNOSIS — R1084 Generalized abdominal pain: Secondary | ICD-10-CM

## 2021-12-27 IMAGING — CT CT HEAD W/O CM
3 series · 14 of 45 positions shown, 16 images · non-contrast
Comparison: None.

CLINICAL DATA: Laceration fall

EXAM:
CT HEAD WITHOUT CONTRAST
CT CERVICAL SPINE WITHOUT CONTRAST
TECHNIQUE: Multidetector CT imaging of the head and cervical spine was
performed following the standard protocol without intravenous
contrast. Multiplanar CT image reconstructions of the cervical spine
were also generated.

[Series 2: head wo · axial · 0.47mm/px · z∈[-186,-71]mm · 8 of 28 slices shown, 10 images]
[im 3/28  brain]
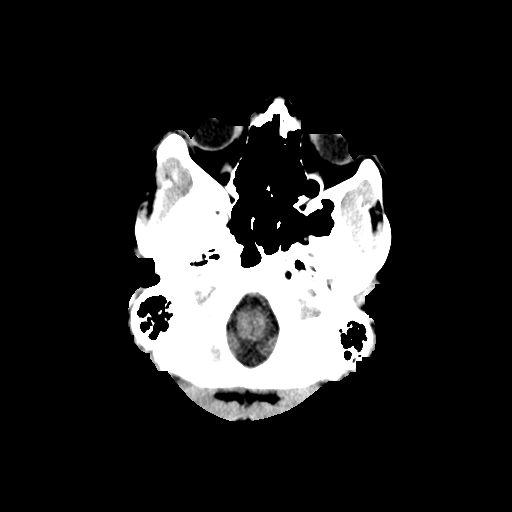
[im 3/28  bone]
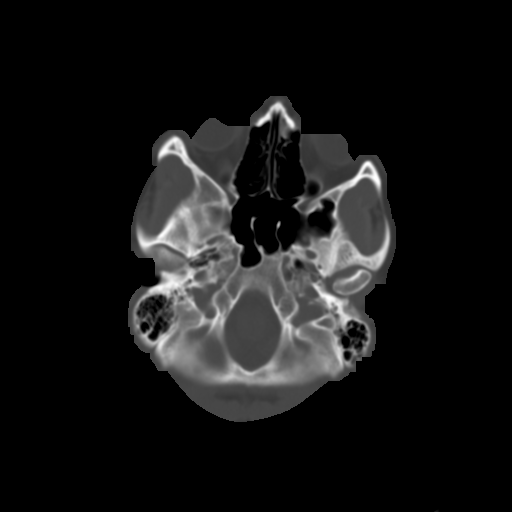
[im 6/28  brain]
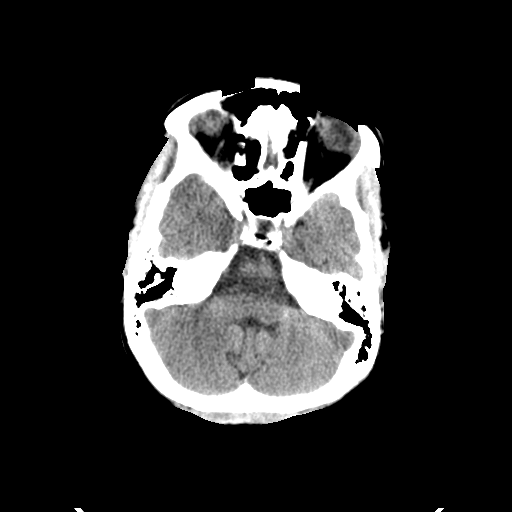
[im 10/28  brain]
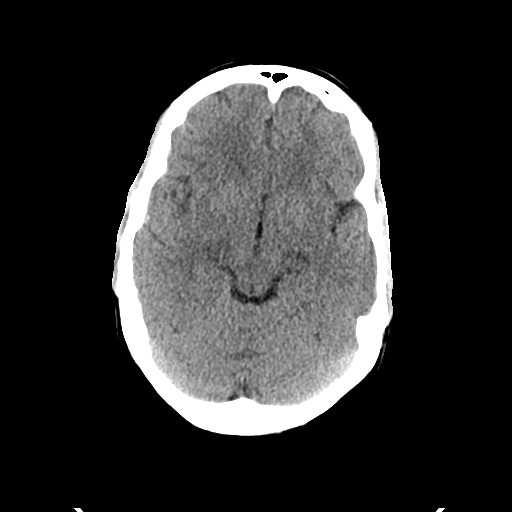
[im 13/28  brain]
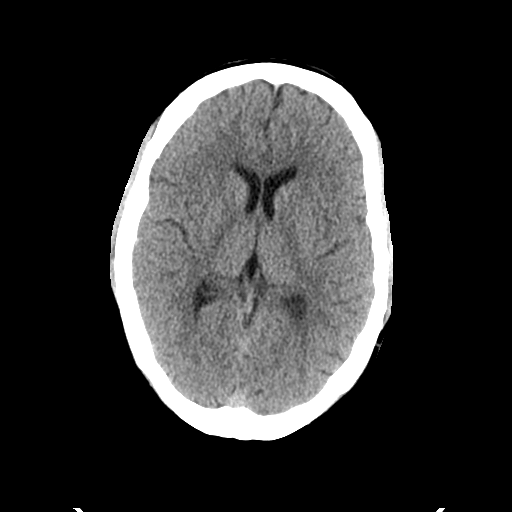
[im 16/28  brain]
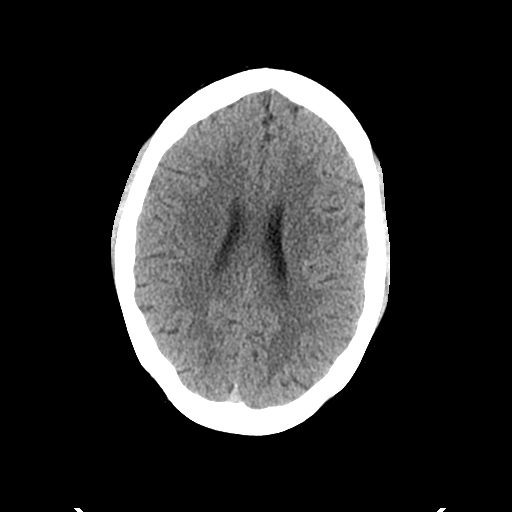
[im 16/28  bone]
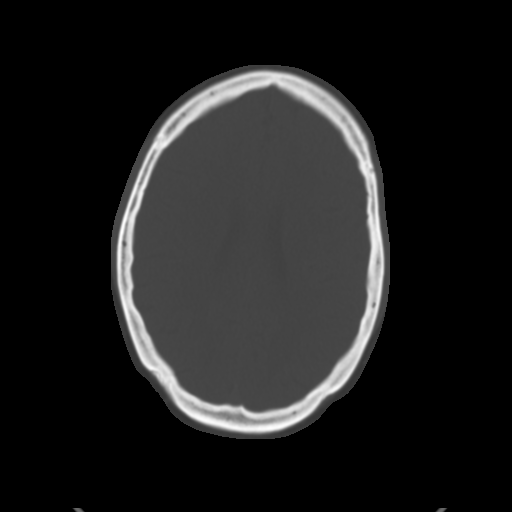
[im 19/28  brain]
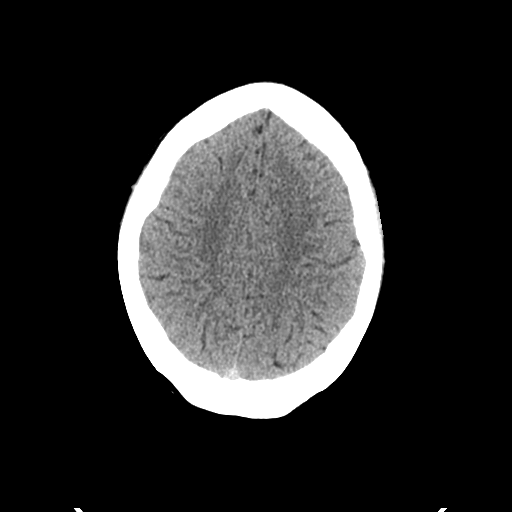
[im 23/28  brain]
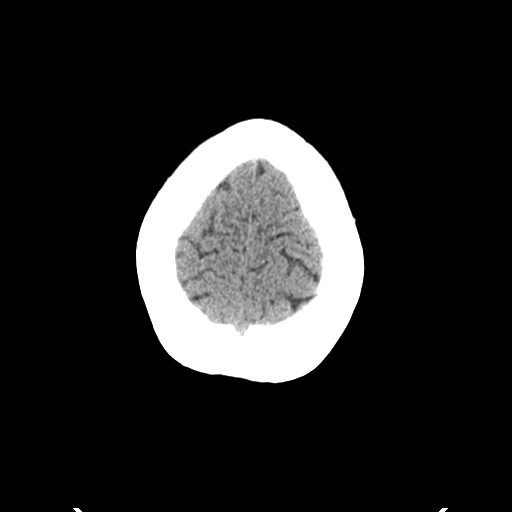
[im 26/28  brain]
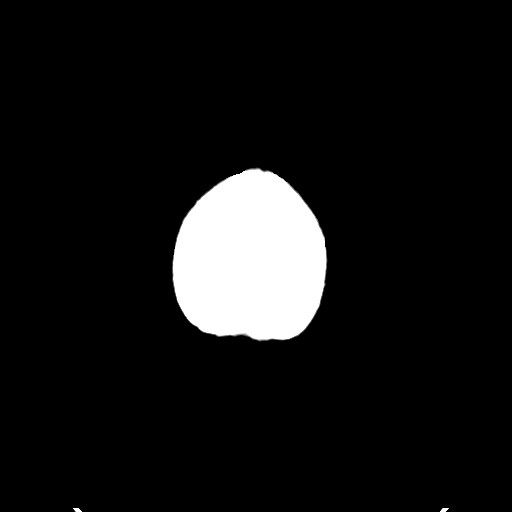

[Series 5: coronal soft tissue · coronal · 0.33mm/px · 3 of 63 slices shown]
[im 21/63  brain]
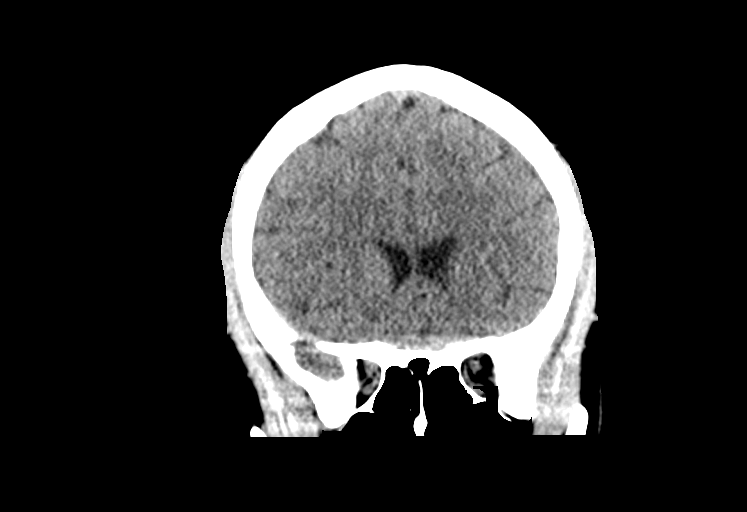
[im 28/63  brain]
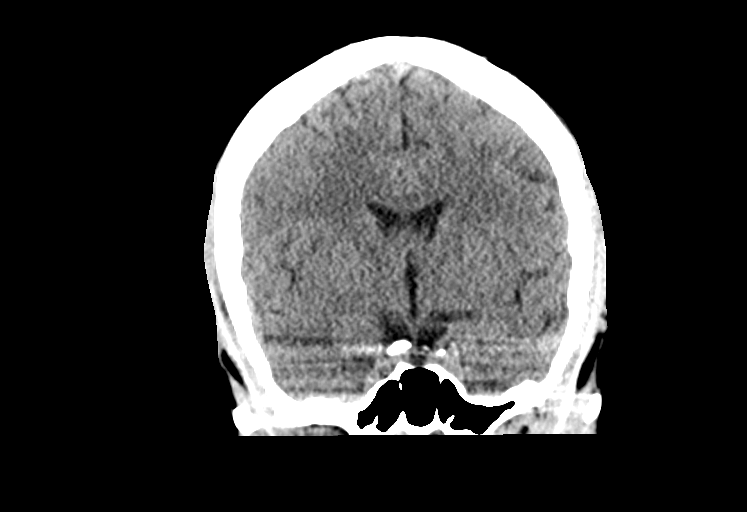
[im 35/63  brain]
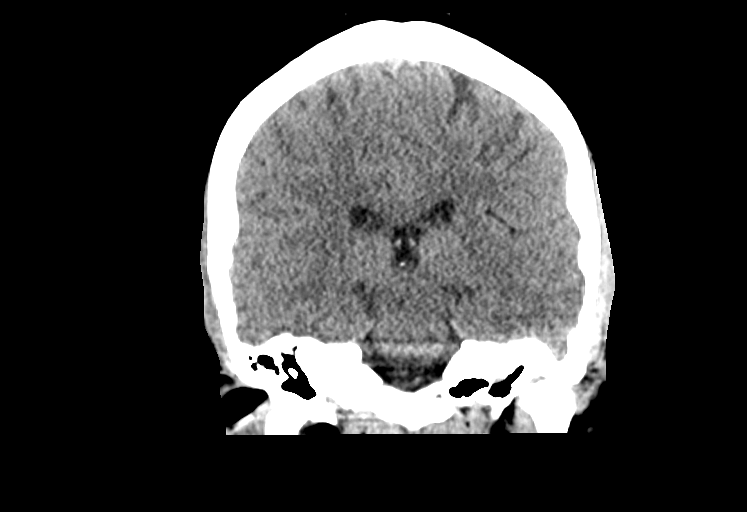

[Series 6: sagittal soft tissue · sagittal · 0.34mm/px · 3 of 45 slices shown]
[im 15/45  brain]
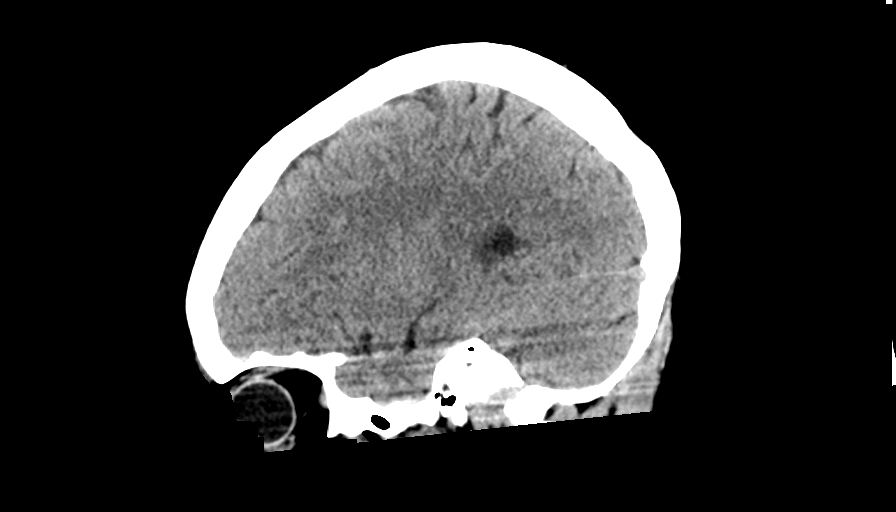
[im 23/45  brain]
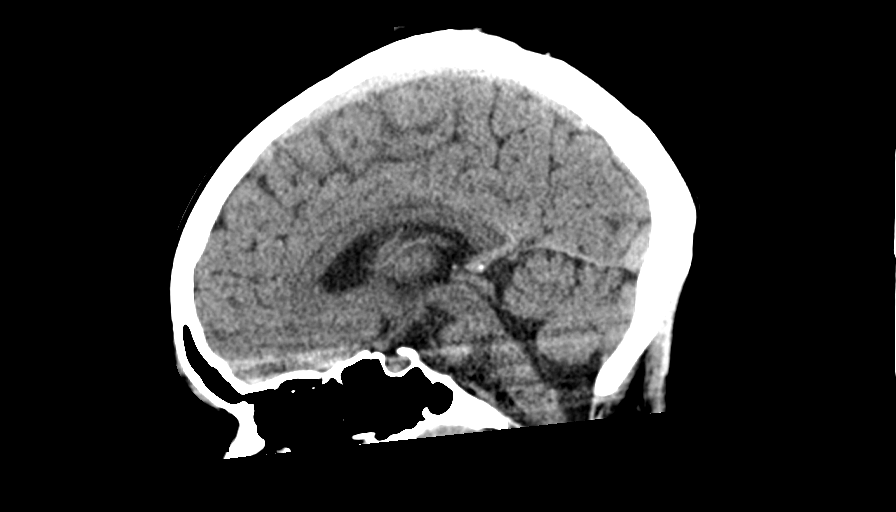
[im 30/45  brain]
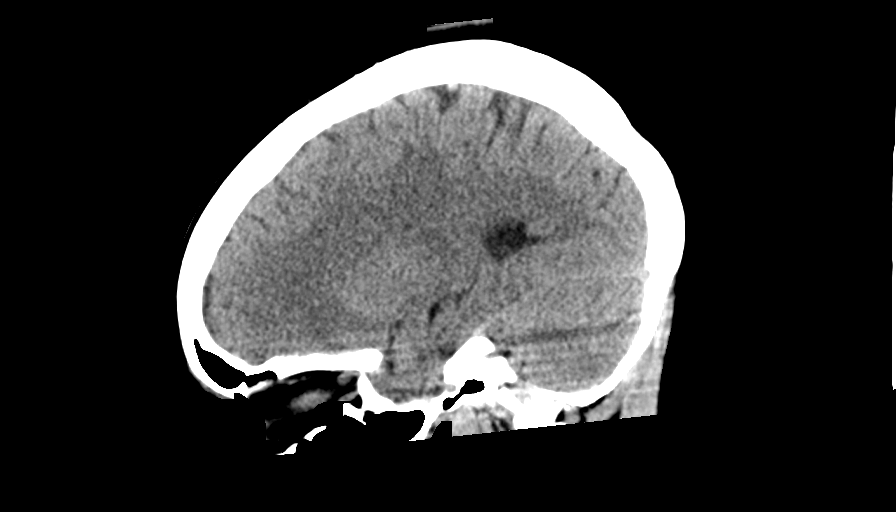

[14 of 45 positions shown; findings below may reference images not displayed]

FINDINGS: CT HEAD FINDINGS

Brain: No evidence of acute infarction, hemorrhage, hydrocephalus,
extra-axial collection or mass lesion/mass effect.

Vascular: No hyperdense vessel or unexpected calcification.

Skull: Normal. Negative for fracture or focal lesion.

Sinuses/Orbits: No acute finding.

Other: Small forehead laceration.

CT CERVICAL SPINE FINDINGS

Alignment: Mild reversal of cervical lordosis. No subluxation. Facet
alignment within normal limits

Skull base and vertebrae: No acute fracture. No primary bone lesion
or focal pathologic process.

Soft tissues and spinal canal: No prevertebral fluid or swelling. No
visible canal hematoma.

Disc levels:  Within normal limits

Upper chest: Negative.

Other: None
IMPRESSION: 1. Negative non contrasted CT appearance of the brain. Small
forehead laceration
2. Reversal of cervical lordosis.  No acute osseous abnormality.

## 2021-12-27 IMAGING — CT CT CERVICAL SPINE W/O CM
3 of 4 series · 13 of 33 positions shown, 16 images · non-contrast
Comparison: None.

CLINICAL DATA: Laceration fall

EXAM:
CT HEAD WITHOUT CONTRAST
CT CERVICAL SPINE WITHOUT CONTRAST
TECHNIQUE: Multidetector CT imaging of the head and cervical spine was
performed following the standard protocol without intravenous
contrast. Multiplanar CT image reconstructions of the cervical spine
were also generated.

[Series 4: sagittal bone · sagittal · 0.36mm/px · 5 of 38 slices shown, 6 images]
[im 13/38  bone]
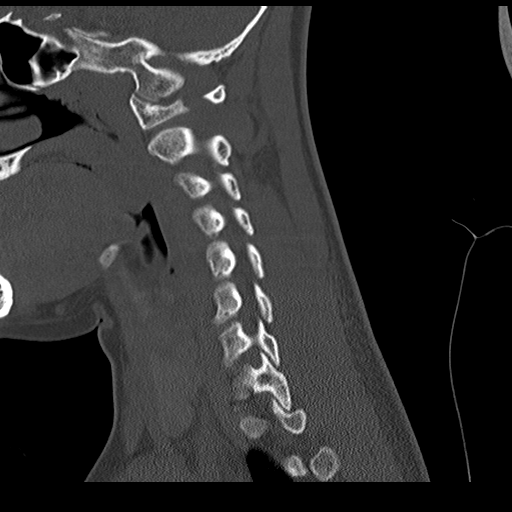
[im 16/38  bone]
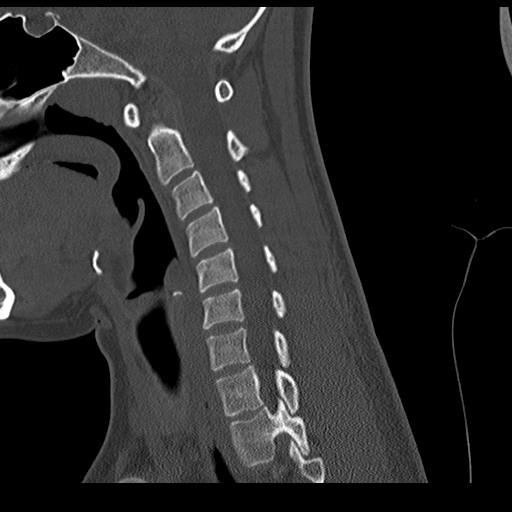
[im 19/38  soft-tissue]
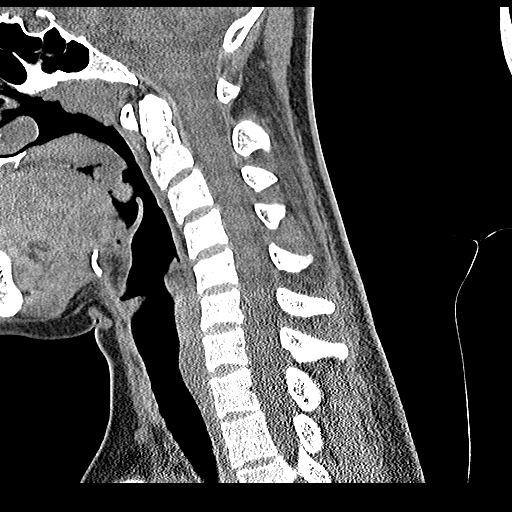
[im 19/38  bone]
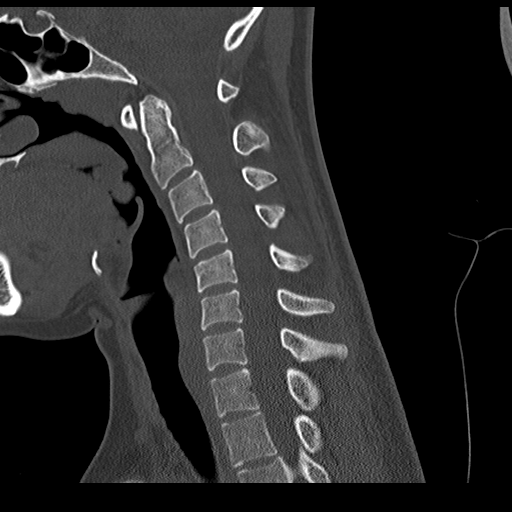
[im 22/38  bone]
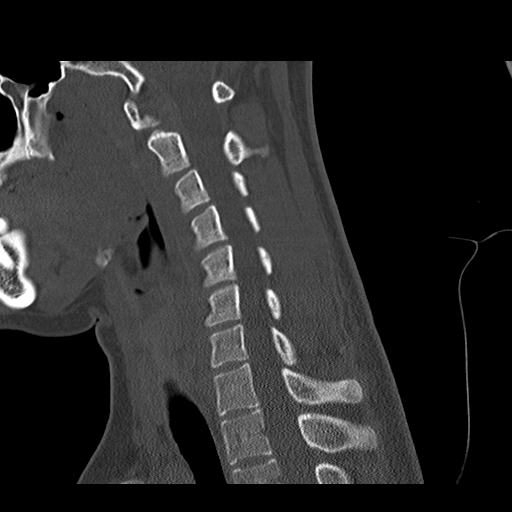
[im 25/38  bone]
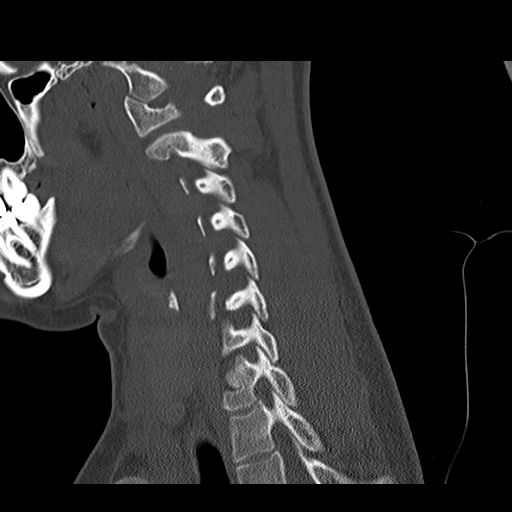

[Series 5: orthogonal bone · axial · 0.18mm/px · z∈[-311,-194]mm · 5 of 92 slices shown, 7 images]
[im 16/92  soft-tissue]
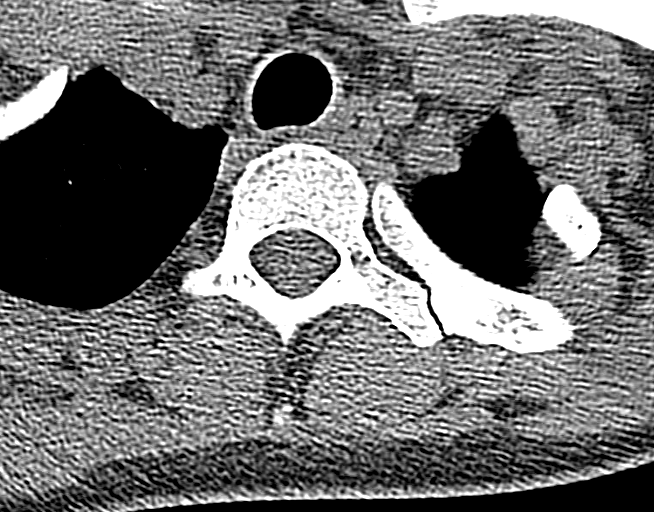
[im 16/92  bone]
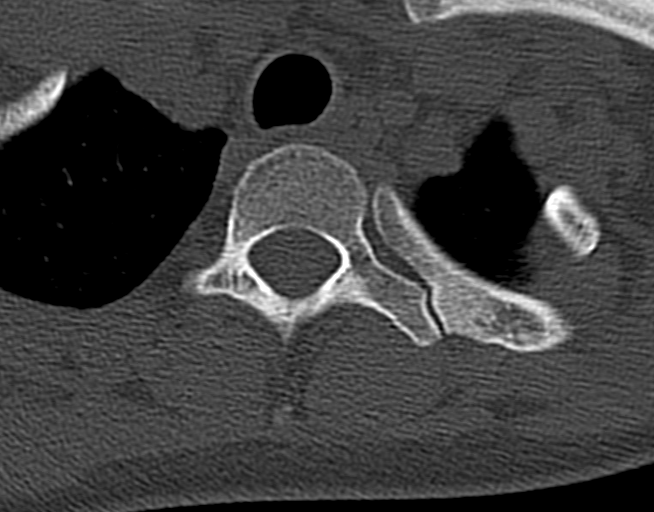
[im 31/92  bone]
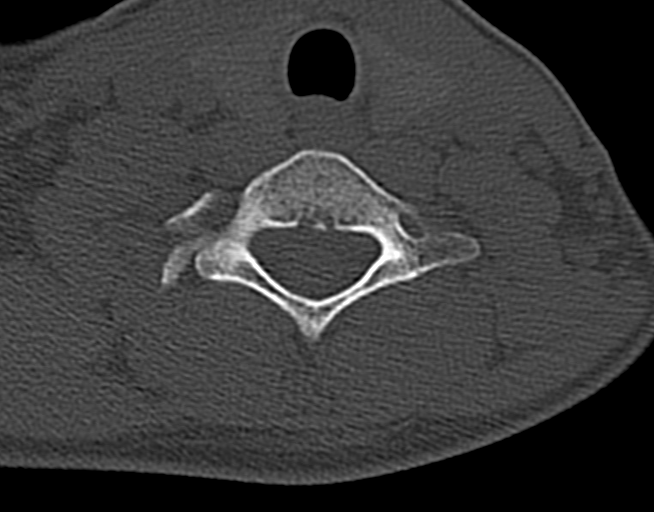
[im 46/92  bone]
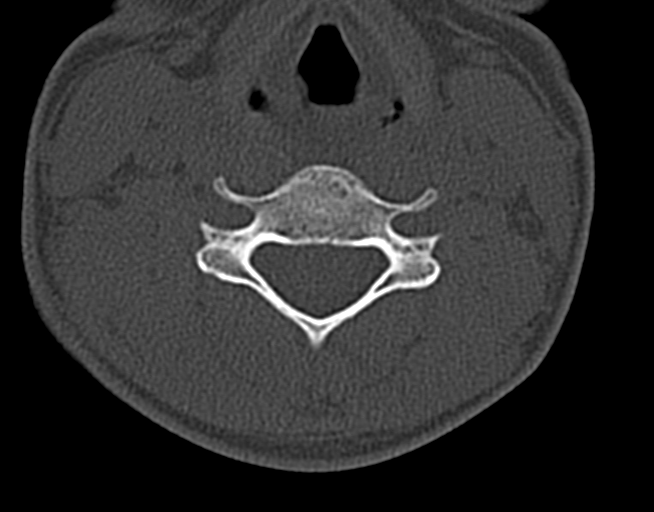
[im 61/92  bone]
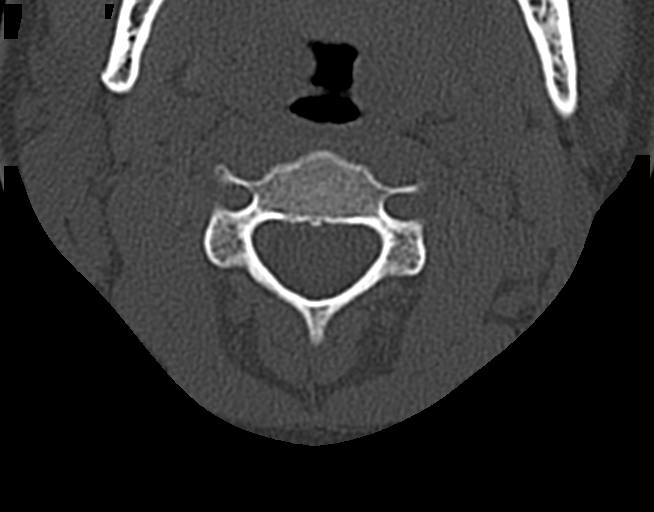
[im 76/92  soft-tissue]
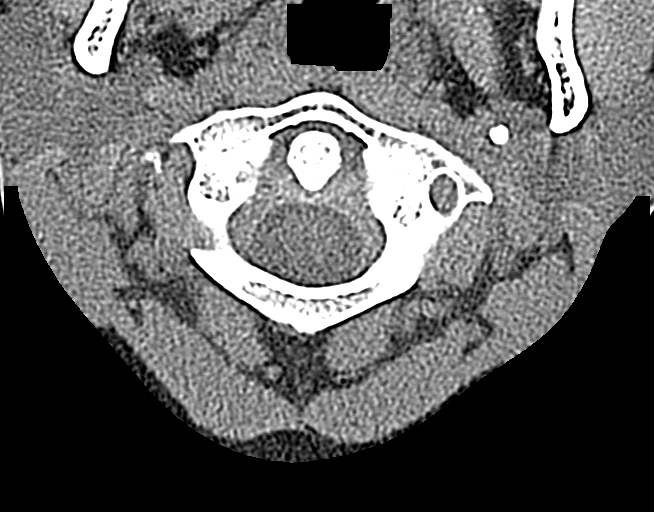
[im 76/92  bone]
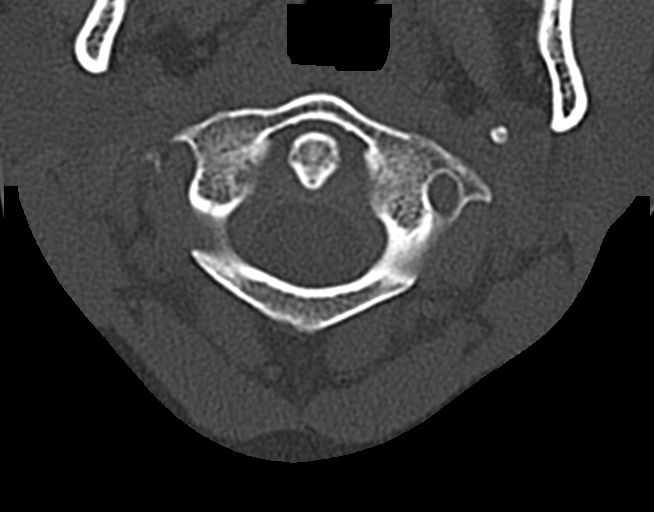

[Series 6: coronal bone · coronal · 0.26mm/px · 3 of 41 slices shown]
[im 9/41  bone]
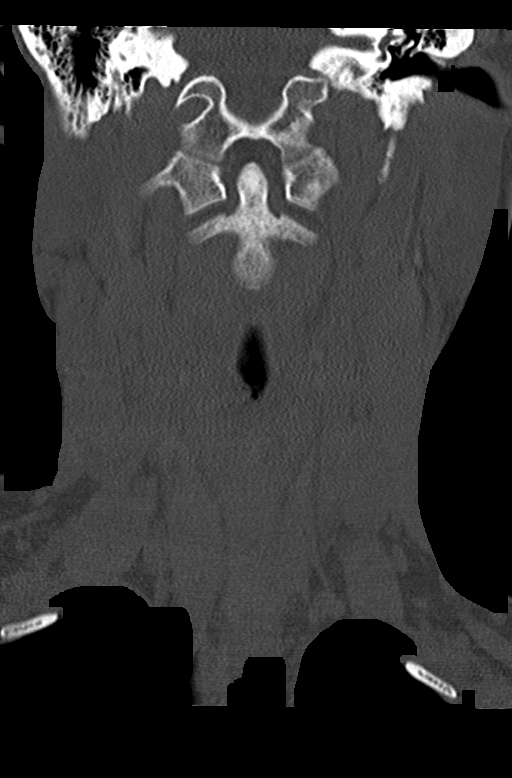
[im 17/41  bone]
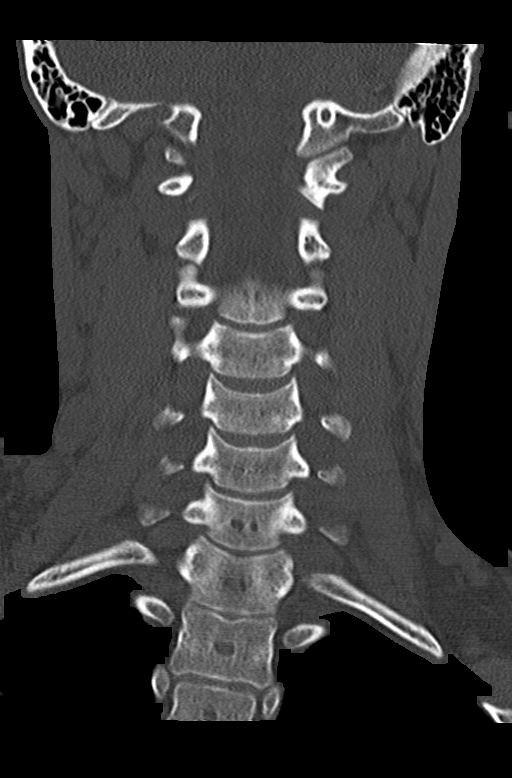
[im 25/41  bone]
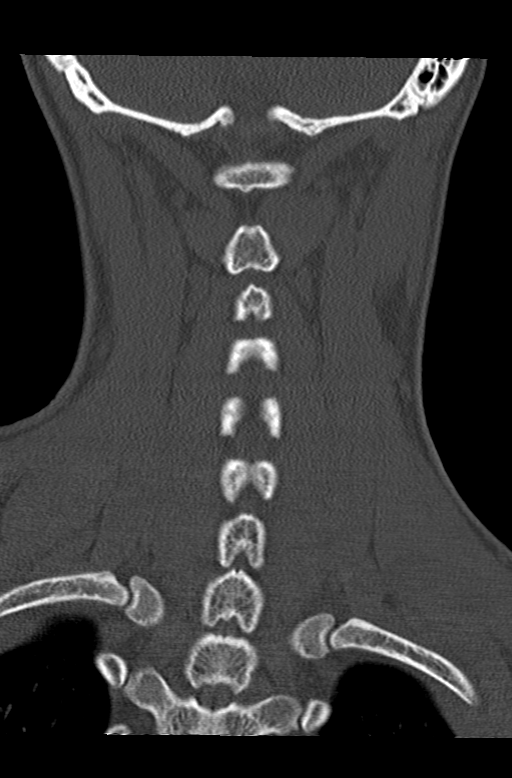

[13 of 33 positions shown; findings below may reference images not displayed]

FINDINGS: CT HEAD FINDINGS

Brain: No evidence of acute infarction, hemorrhage, hydrocephalus,
extra-axial collection or mass lesion/mass effect.

Vascular: No hyperdense vessel or unexpected calcification.

Skull: Normal. Negative for fracture or focal lesion.

Sinuses/Orbits: No acute finding.

Other: Small forehead laceration.

CT CERVICAL SPINE FINDINGS

Alignment: Mild reversal of cervical lordosis. No subluxation. Facet
alignment within normal limits

Skull base and vertebrae: No acute fracture. No primary bone lesion
or focal pathologic process.

Soft tissues and spinal canal: No prevertebral fluid or swelling. No
visible canal hematoma.

Disc levels:  Within normal limits

Upper chest: Negative.

Other: None
IMPRESSION: 1. Negative non contrasted CT appearance of the brain. Small
forehead laceration
2. Reversal of cervical lordosis.  No acute osseous abnormality.

## 2022-02-24 IMAGING — US US PELVIS COMPLETE
1 series · 14 of 25 positions shown · non-contrast
Comparison: None.

CLINICAL DATA: Pelvic pain during sexual intercourse for several
years

EXAM:
TRANSABDOMINAL ULTRASOUND OF PELVIS
TECHNIQUE: Transabdominal ultrasound examination of the pelvis was performed
including evaluation of the uterus, ovaries, adnexal regions, and
pelvic cul-de-sac.

[Series 1: us pelvis complete · 0.23mm/px · 14 of 38 slices shown]
[im 1/38]
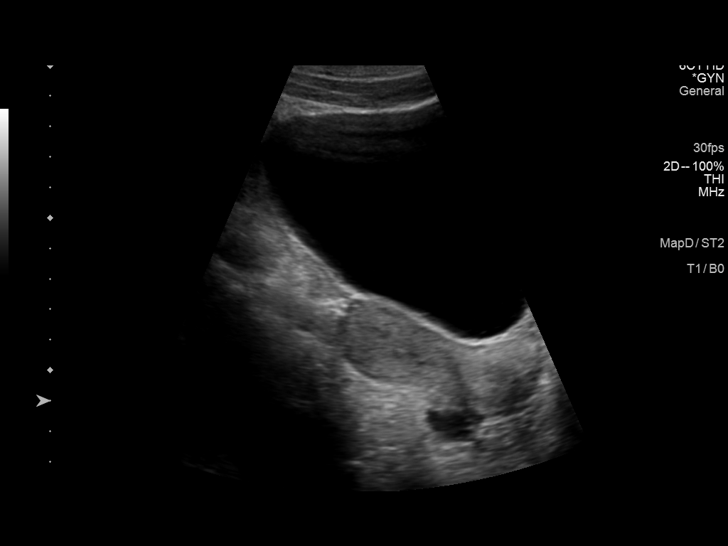
[im 4/38]
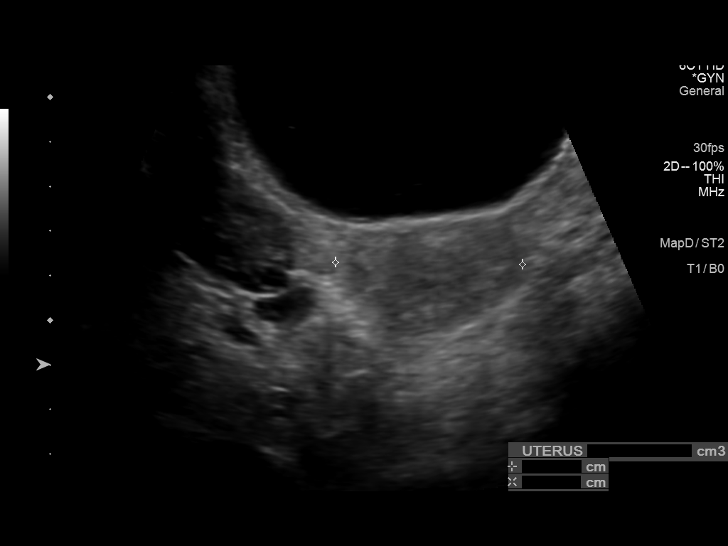
[im 7/38]
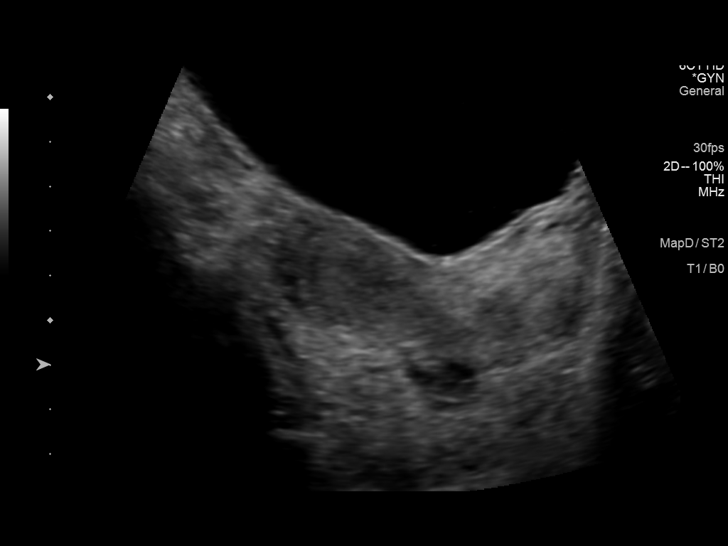
[im 10/38]
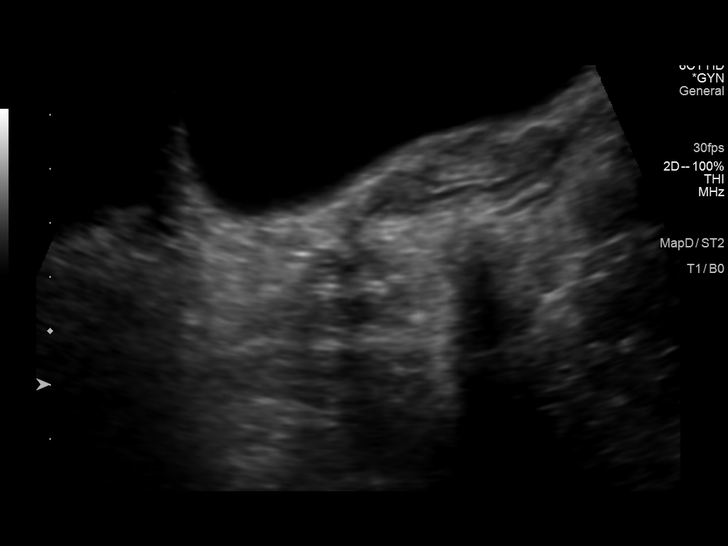
[im 13/38]
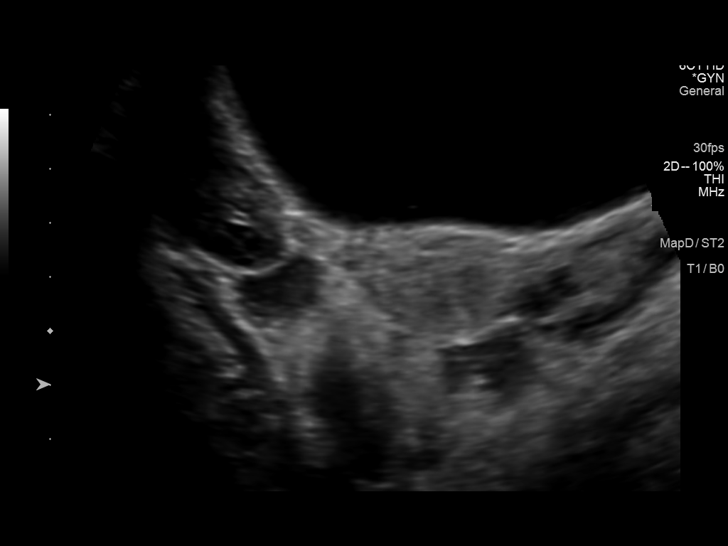
[im 14/38]
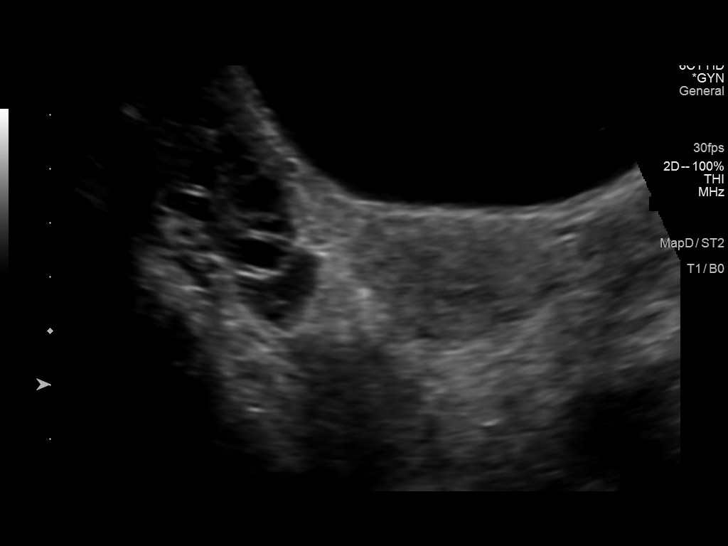
[im 17/38]
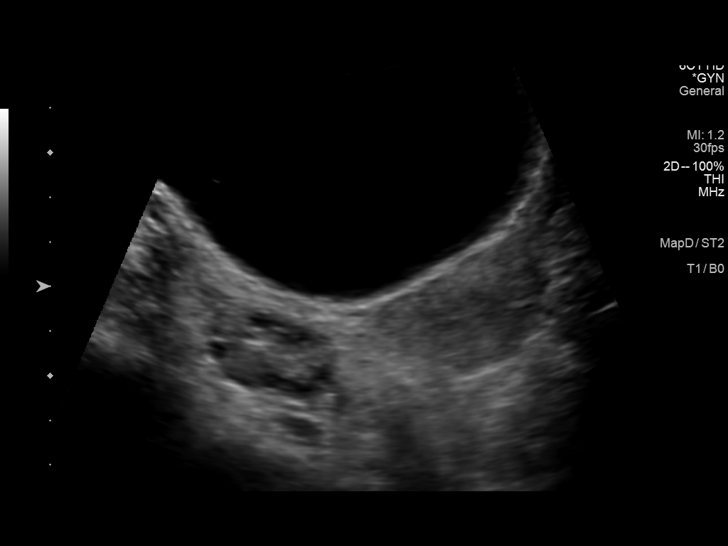
[im 21/38]
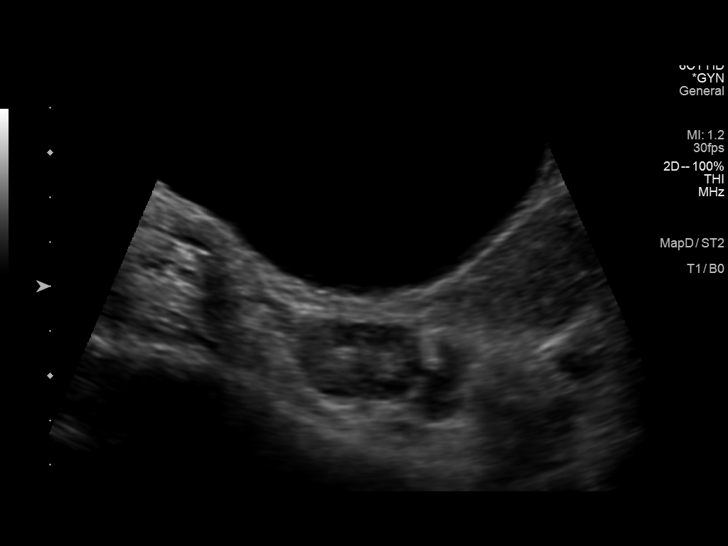
[im 24/38]
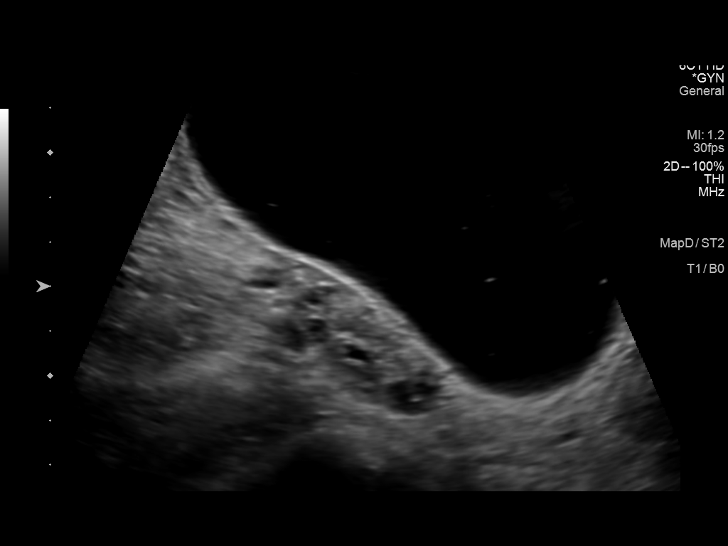
[im 25/38]
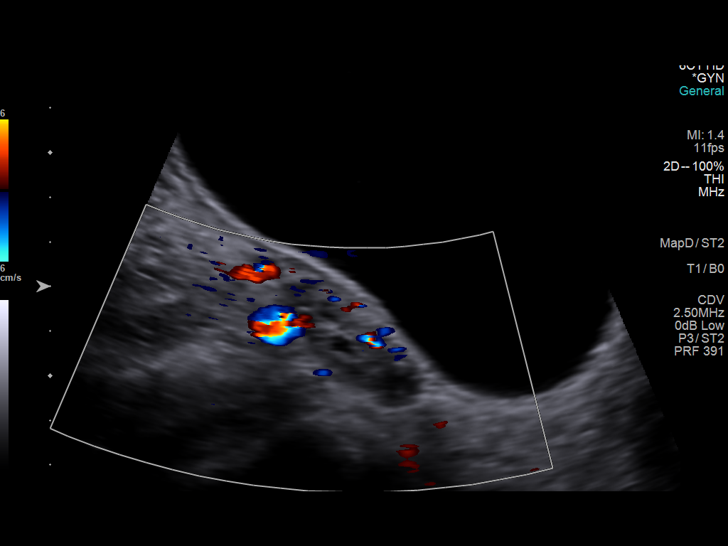
[im 28/38]
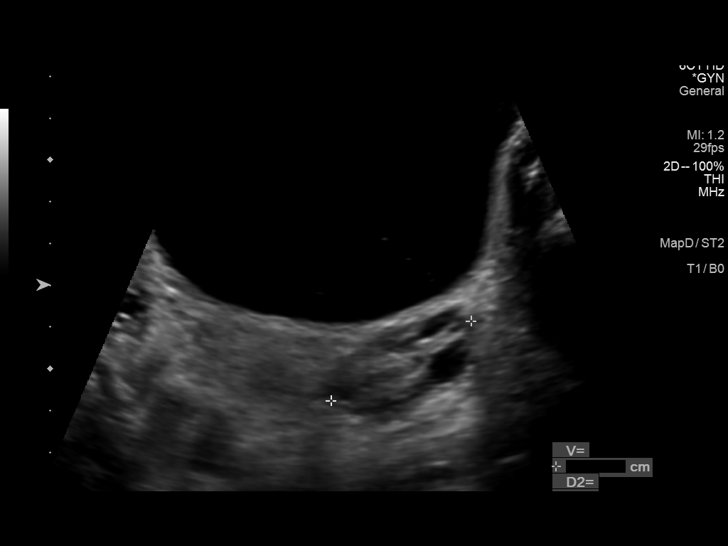
[im 31/38]
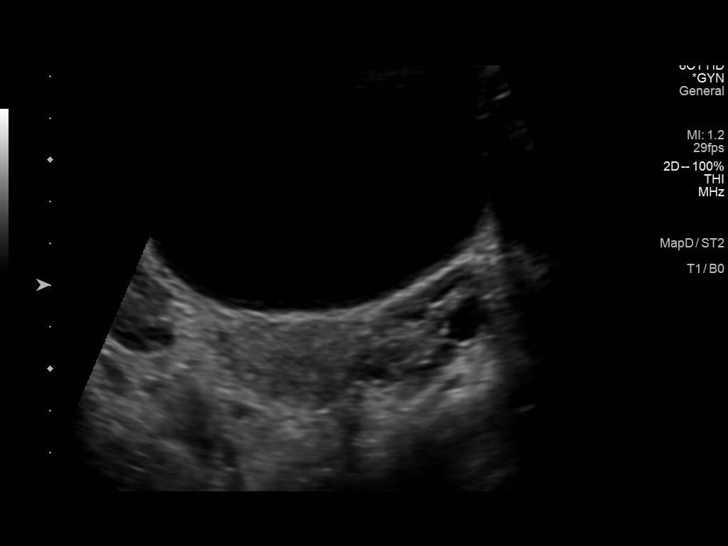
[im 34/38]
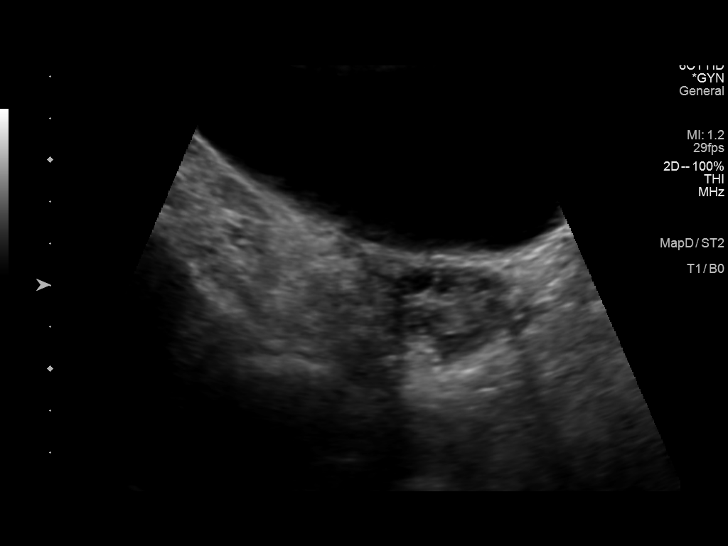
[im 38/38]
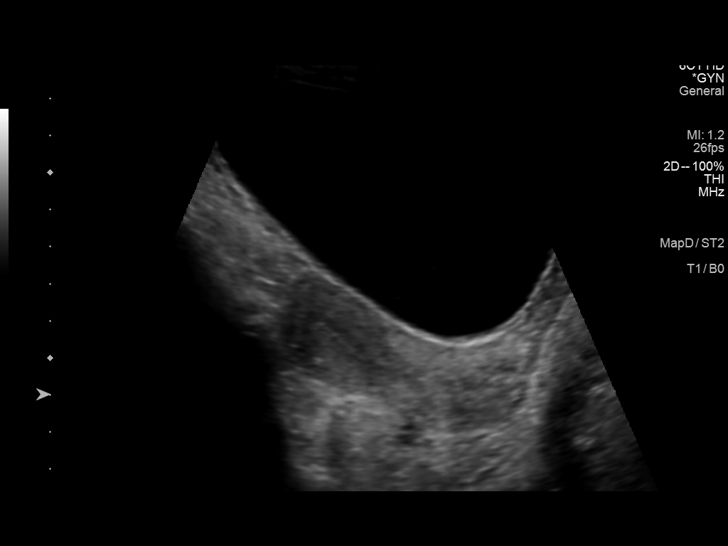

[14 of 25 positions shown; findings below may reference images not displayed]

FINDINGS: Uterus

Measurements: 7.0 x 2.3 x 4.2 cm = volume: 35 mL. No fibroids or
other mass visualized.

Endometrium

Thickness: 3 mm.  No focal abnormality visualized.

Right ovary

Measurements: 4.3 x 1.8 x 3.2 cm = volume: 12.8 mL. Normal
appearance.

Left ovary

Measurements: 3.3 x 2.0 x 0.9 cm = volume: 13.6 mL. Normal
appearance.

Other findings:  No abnormal free fluid.
IMPRESSION: Normal sonographic appearance of the uterus and ovaries.

## 2022-02-24 IMAGING — US US ABDOMEN COMPLETE
1 series · 14 of 25 positions shown · non-contrast
Comparison: 12/15/2019

CLINICAL DATA: Abdominal pain for 2 weeks

EXAM:
ABDOMEN ULTRASOUND COMPLETE

[Series 1: us abdomen complete · 0.11mm/px · 14 of 91 slices shown]
[im 1/91]
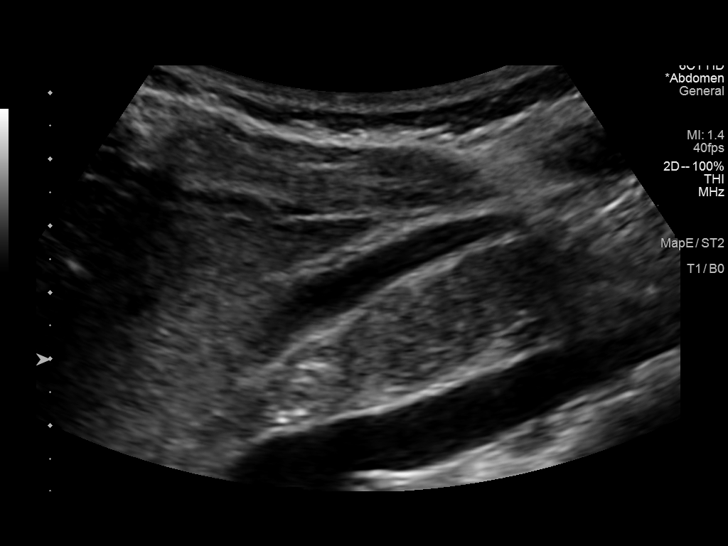
[im 8/91]
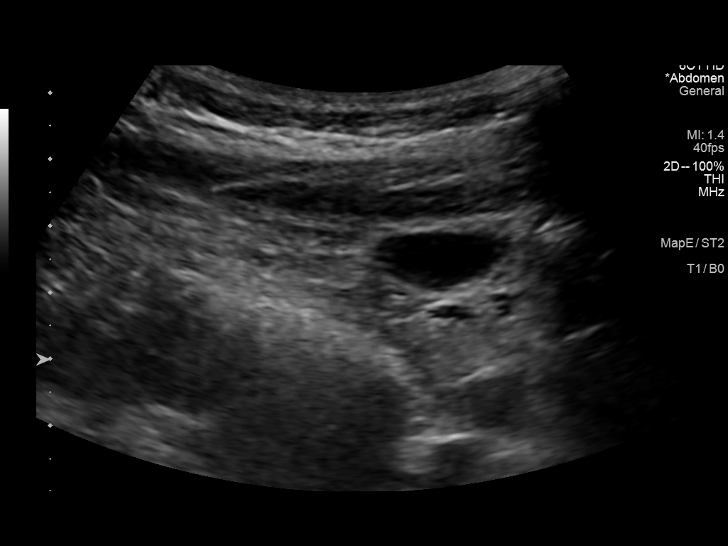
[im 16/91]
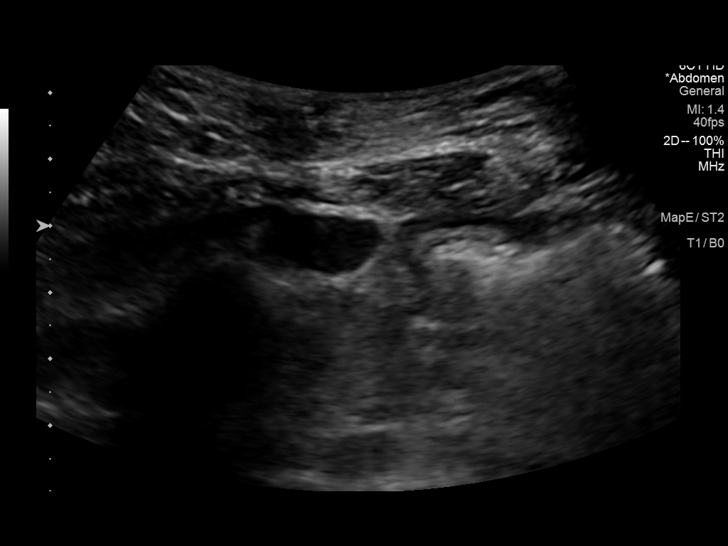
[im 23/91]
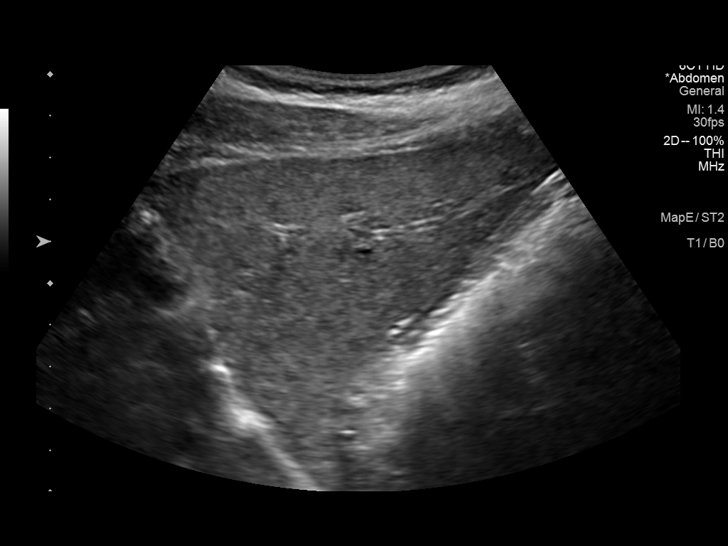
[im 31/91]
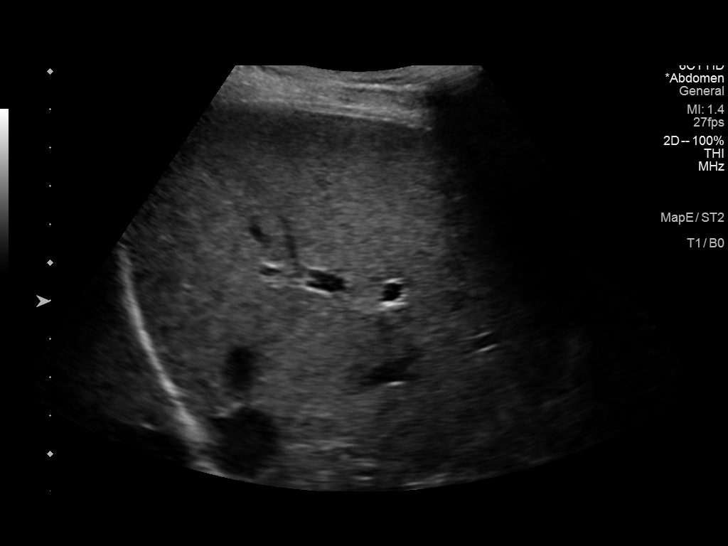
[im 34/91]
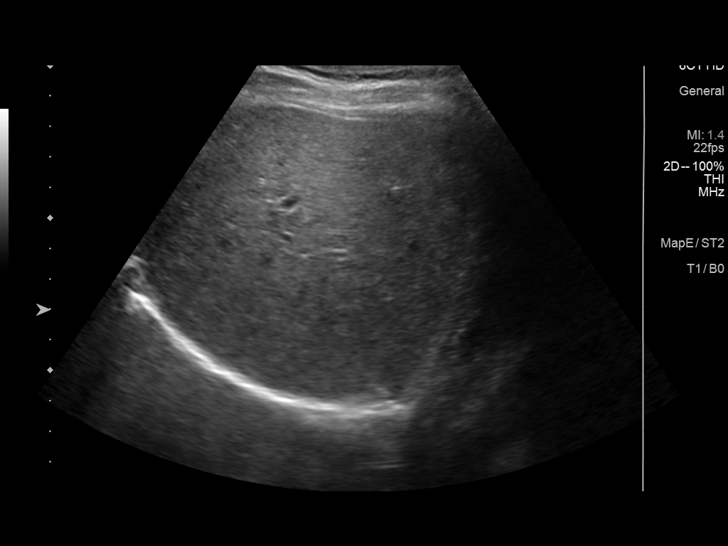
[im 42/91]
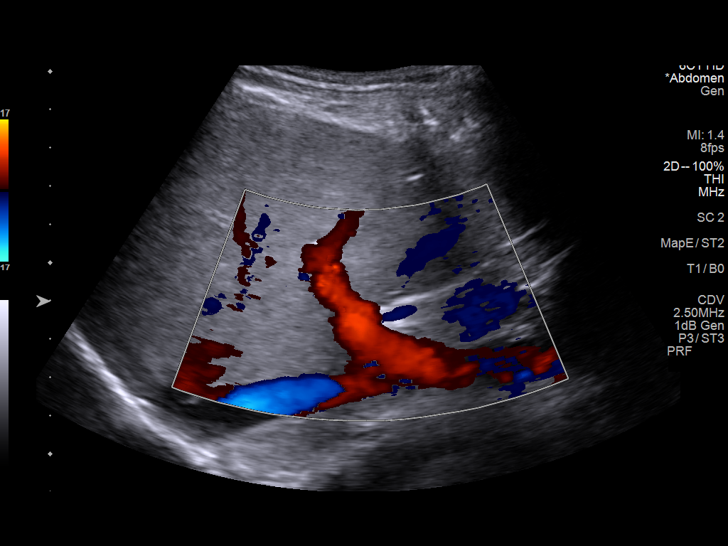
[im 49/91]
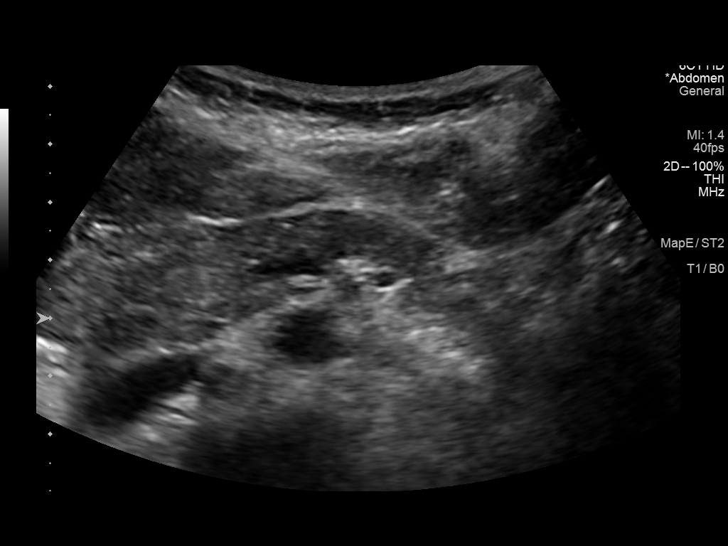
[im 57/91]
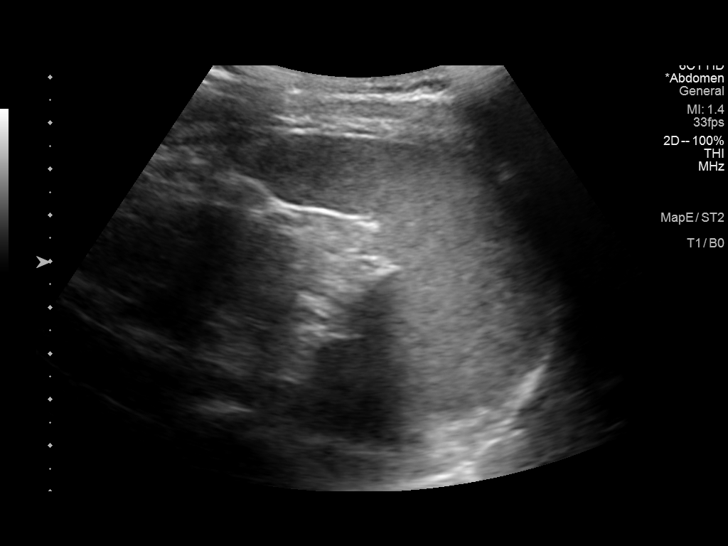
[im 61/91]
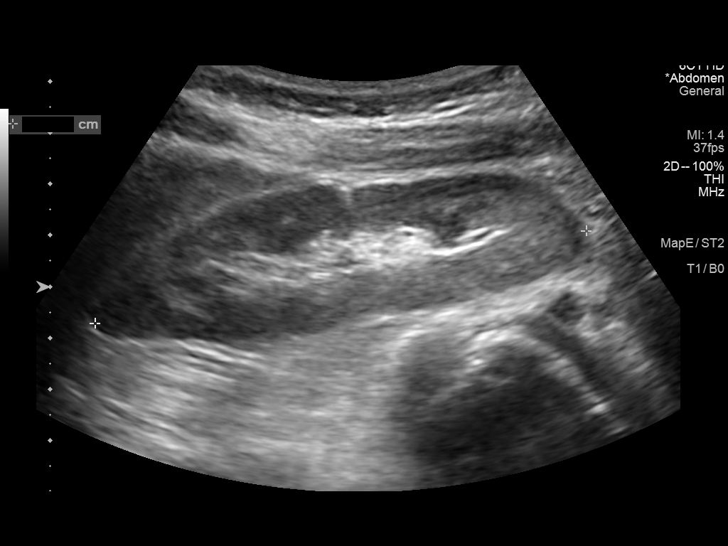
[im 68/91]
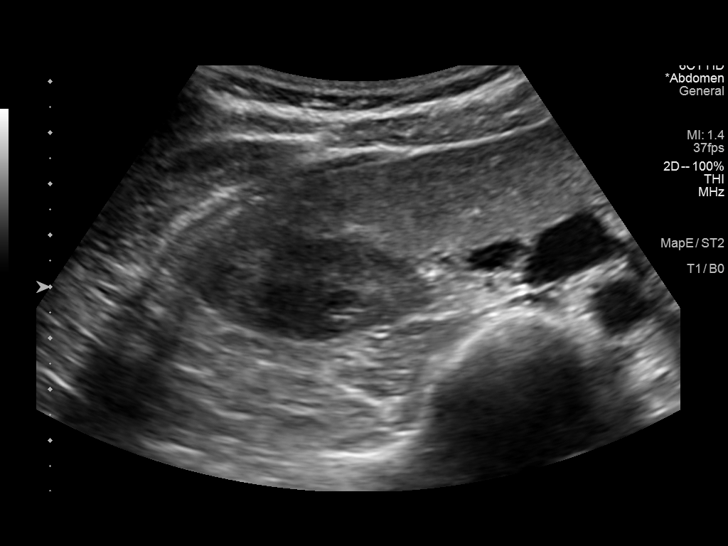
[im 76/91]
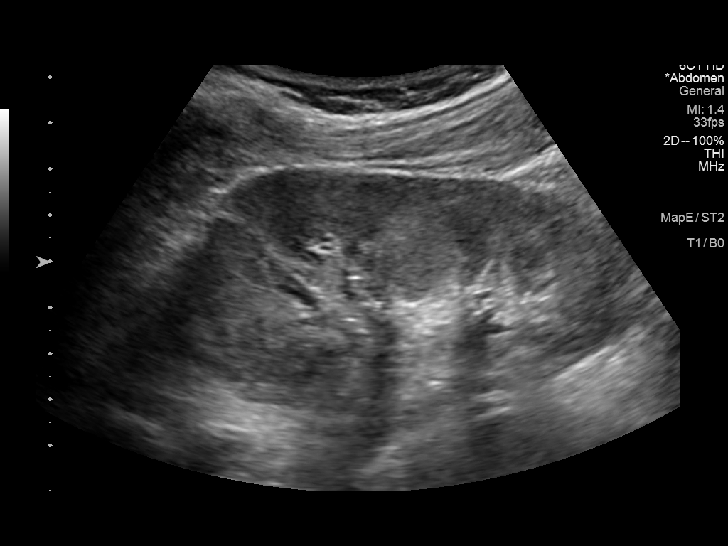
[im 83/91]
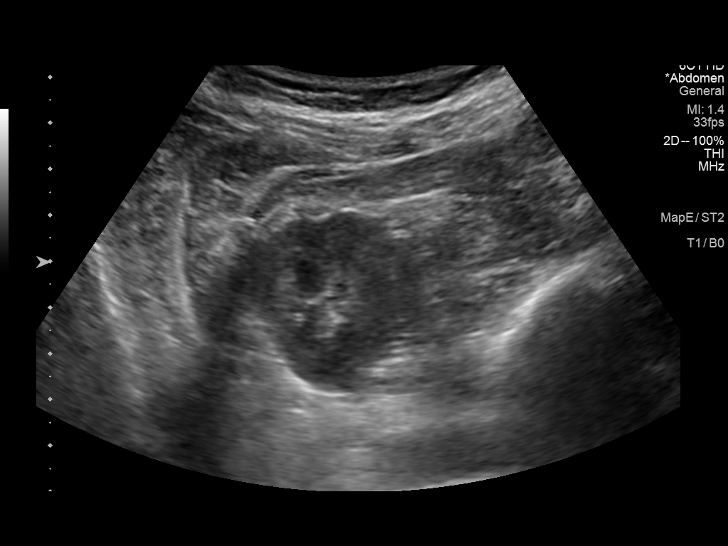
[im 91/91]
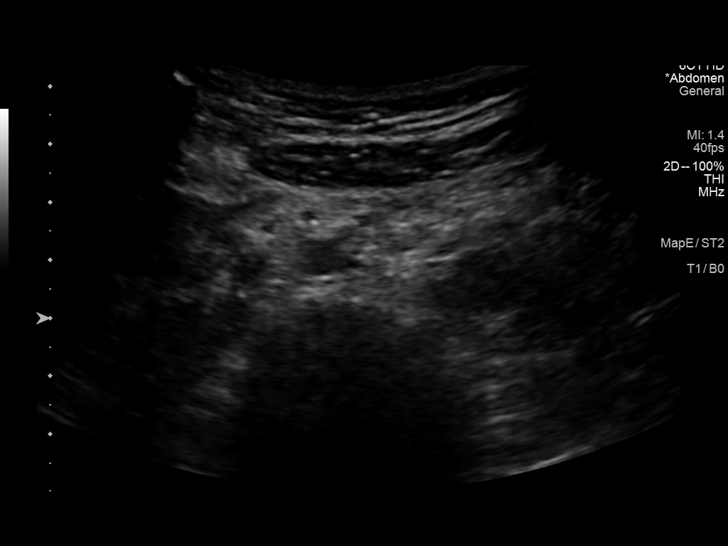

[14 of 25 positions shown; findings below may reference images not displayed]

FINDINGS: Gallbladder: No gallstones or wall thickening visualized. No
sonographic Murphy sign noted by sonographer.

Common bile duct: Diameter: 4 mm

Liver: No focal lesion identified. Within normal limits in
parenchymal echogenicity. Portal vein is patent on color Doppler
imaging with normal direction of blood flow towards the liver.

IVC: No abnormality visualized.

Pancreas: Visualized portion unremarkable.

Spleen: Size and appearance within normal limits.

Right Kidney: Length: 9.7 cm. Echogenicity within normal limits. No
mass or hydronephrosis visualized.

Left Kidney: Length: 10.6 cm. Echogenicity within normal limits. No
mass or hydronephrosis visualized.

Abdominal aorta: No aneurysm visualized.

Other findings: None.
IMPRESSION: Normal abdominal ultrasound.

## 2023-02-06 ENCOUNTER — Emergency Department (HOSPITAL_COMMUNITY)
Admission: EM | Admit: 2023-02-06 | Discharge: 2023-02-07 | Disposition: A | Discharge status: Other Acute Inpatient Hospital | Payer: Managed Care, Other (non HMO) | Attending: Student | Admitting: Student

## 2023-02-06 ENCOUNTER — Encounter (HOSPITAL_COMMUNITY): Payer: Self-pay

## 2023-02-06 ENCOUNTER — Other Ambulatory Visit: Payer: Self-pay

## 2023-02-06 DIAGNOSIS — F1020 Alcohol dependence, uncomplicated: Secondary | ICD-10-CM | POA: Insufficient documentation

## 2023-02-06 DIAGNOSIS — F1220 Cannabis dependence, uncomplicated: Secondary | ICD-10-CM | POA: Insufficient documentation

## 2023-02-06 DIAGNOSIS — F333 Major depressive disorder, recurrent, severe with psychotic symptoms: Secondary | ICD-10-CM | POA: Insufficient documentation

## 2023-02-06 DIAGNOSIS — R45851 Suicidal ideations: Secondary | ICD-10-CM | POA: Diagnosis not present

## 2023-02-06 DIAGNOSIS — R440 Auditory hallucinations: Secondary | ICD-10-CM | POA: Diagnosis present

## 2023-02-06 HISTORY — DX: Bipolar disorder, unspecified: F31.9

## 2023-02-06 LAB — URINALYSIS, ROUTINE W REFLEX MICROSCOPIC
Bilirubin Urine: NEGATIVE
Glucose, UA: NEGATIVE mg/dL
Ketones, ur: 5 mg/dL — AB
Nitrite: NEGATIVE
Protein, ur: 30 mg/dL — AB
Specific Gravity, Urine: 1.021 (ref 1.005–1.030)
pH: 5 (ref 5.0–8.0)

## 2023-02-06 LAB — COMPREHENSIVE METABOLIC PANEL
ALT: 9 U/L (ref 0–44)
AST: 19 U/L (ref 15–41)
Albumin: 4.4 g/dL (ref 3.5–5.0)
Alkaline Phosphatase: 45 U/L (ref 38–126)
Anion gap: 11 (ref 5–15)
BUN: 7 mg/dL (ref 6–20)
CO2: 23 mmol/L (ref 22–32)
Calcium: 9.2 mg/dL (ref 8.9–10.3)
Chloride: 100 mmol/L (ref 98–111)
Creatinine, Ser: 0.87 mg/dL (ref 0.44–1.00)
GFR, Estimated: >60 mL/min (ref >60)
Glucose, Bld: 126 mg/dL — ABNORMAL HIGH (ref 70–99)
Potassium: 3.3 mmol/L — ABNORMAL LOW (ref 3.5–5.1)
Sodium: 134 mmol/L — ABNORMAL LOW (ref 135–145)
Total Bilirubin: 1 mg/dL (ref 0.3–1.2)
Total Protein: 7.6 g/dL (ref 6.5–8.1)

## 2023-02-06 LAB — CBC WITH DIFFERENTIAL/PLATELET
Abs Immature Granulocytes: 0.04 10*3/uL (ref 0.00–0.07)
Basophils Absolute: 0.1 10*3/uL (ref 0.0–0.1)
Basophils Relative: 1 %
Eosinophils Absolute: 0 10*3/uL (ref 0.0–0.5)
Eosinophils Relative: 0 %
HCT: 40.7 % (ref 36.0–46.0)
Hemoglobin: 14 g/dL (ref 12.0–15.0)
Immature Granulocytes: 1 %
Lymphocytes Relative: 23 %
Lymphs Abs: 1.9 10*3/uL (ref 0.7–4.0)
MCH: 31 pg (ref 26.0–34.0)
MCHC: 34.4 g/dL (ref 30.0–36.0)
MCV: 90 fL (ref 80.0–100.0)
Monocytes Absolute: 0.7 10*3/uL (ref 0.1–1.0)
Monocytes Relative: 8 %
Neutro Abs: 5.6 10*3/uL (ref 1.7–7.7)
Neutrophils Relative %: 67 %
Platelets: 331 10*3/uL (ref 150–400)
RBC: 4.52 MIL/uL (ref 3.87–5.11)
RDW: 13.7 % (ref 11.5–15.5)
WBC: 8.3 10*3/uL (ref 4.0–10.5)
nRBC: 0 % (ref 0.0–0.2)

## 2023-02-06 LAB — ACETAMINOPHEN LEVEL: Acetaminophen (Tylenol), Serum: <10 ug/mL — ABNORMAL LOW (ref 10–30)

## 2023-02-06 LAB — I-STAT BETA HCG BLOOD, ED (MC, WL, AP ONLY): I-stat hCG, quantitative: <5 m[IU]/mL (ref <5)

## 2023-02-06 LAB — RAPID URINE DRUG SCREEN, HOSP PERFORMED
Amphetamines: NOT DETECTED
Barbiturates: NOT DETECTED
Benzodiazepines: NOT DETECTED
Cocaine: NOT DETECTED
Opiates: NOT DETECTED
Tetrahydrocannabinol: POSITIVE — AB

## 2023-02-06 LAB — ETHANOL: Alcohol, Ethyl (B): <10 mg/dL (ref <10)

## 2023-02-06 LAB — SALICYLATE LEVEL: Salicylate Lvl: <7 mg/dL — ABNORMAL LOW (ref 7.0–30.0)

## 2023-02-06 MED ORDER — QUETIAPINE FUMARATE ER 50 MG PO TB24
50.0000 mg | ORAL_TABLET | Freq: Every day | ORAL | Status: DC
  Administered 2023-02-07: 50 mg via ORAL
  Filled 2023-02-06 (×3): qty 1

## 2023-02-06 MED ORDER — HYDROXYZINE HCL 25 MG PO TABS: 25.0000 mg | ORAL_TABLET | ORAL | Status: DC

## 2023-02-06 MED ORDER — HYDROXYZINE HCL 25 MG PO TABS: 25.0000 mg | ORAL_TABLET | Freq: Three times a day (TID) | ORAL | Status: DC | PRN

## 2023-02-06 MED ORDER — POTASSIUM CHLORIDE 20 MEQ PO PACK
40.0000 meq | PACK | Freq: Once | ORAL | Status: AC
  Administered 2023-02-07: 40 meq via ORAL
  Filled 2023-02-06: qty 2

## 2023-02-06 MED ORDER — POTASSIUM CHLORIDE CRYS ER 20 MEQ PO TBCR
40.0000 meq | EXTENDED_RELEASE_TABLET | Freq: Once | ORAL | Status: DC
  Administered 2023-02-06: 40 meq via ORAL
  Filled 2023-02-06: qty 2
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
## 2023-02-06 NOTE — ED Provider Notes (Signed)
[place] EMERGENCY DEPARTMENT AT [facility] Provider Note   CSN: [id number] Arrival date & time: 02/06/23  1839     History  Chief Complaint  Patient presents with   Suicidal    Carolyn York is a 22 y.o. female.  Patient presents the emergency room complaining of suicidal ideations with intent to kill self by overdose.  She states she has felt suicidal "for a long time" that she feels much more close to doing it today.  She states that she has been having auditory hallucinations telling her that she is next to die.  She also states that she has had visual hallucinations in which her boyfriend has killed himself.  PMH significant for bipolar disorder.   HPI     Home Medications Prior to Admission medications   Medication Sig Start Date End Date Taking? Authorizing Provider  fluconazole (DIFLUCAN) 150 MG tablet Take 1 tablet every 3 days.  After 3 doses, start taking 1 tablet once a week. 11/13/21   Sabas Sous, MD      Allergies    Monistat [miconazole] and Other    Review of Systems   Review of Systems  Physical Exam Updated Vital Signs BP (!) 157/114 (BP Location: Right Arm)   Pulse (!) 102   Temp 97.9 F (36.6 C)   Resp 18   Ht  (1.549 m)   Wt 45.4 kg   SpO2 96%   BMI 18.91 kg/m  Physical Exam Vitals and nursing note reviewed. Exam conducted with a chaperone present.  Constitutional:      General: She is not in acute distress.    Appearance: She is well-developed.  HENT:     Head: Normocephalic and atraumatic.  Eyes:     Conjunctiva/sclera: Conjunctivae normal.  Cardiovascular:     Rate and Rhythm: Normal rate and regular rhythm.     Heart sounds: No murmur heard. Pulmonary:     Effort: Pulmonary effort is normal. No respiratory distress.     Breath sounds: Normal breath sounds.  Abdominal:     Palpations: Abdomen is soft.     Tenderness: There is no abdominal tenderness.  Musculoskeletal:        General: No swelling.      Cervical back: Neck supple.  Skin:    General: Skin is warm and dry.     Capillary Refill: Capillary refill takes less than 2 seconds.  Neurological:     Mental Status: She is alert.  Psychiatric:        Mood and Affect: Mood normal.     ED Results / Procedures / Treatments   Labs (all labs ordered are listed, but only abnormal results are displayed) Labs Reviewed  COMPREHENSIVE METABOLIC PANEL - Abnormal; Notable for the following components:      Result Value   Sodium 134 (*)    Potassium 3.3 (*)    Glucose, Bld 126 (*)    All other components within normal limits  ACETAMINOPHEN LEVEL - Abnormal; Notable for the following components:   Acetaminophen (Tylenol), Serum <10 (*)    All other components within normal limits  SALICYLATE LEVEL - Abnormal; Notable for the following components:   Salicylate Lvl <7.0 (*)    All other components within normal limits  ETHANOL  CBC WITH DIFFERENTIAL/PLATELET  RAPID URINE DRUG SCREEN, HOSP PERFORMED  URINALYSIS, ROUTINE W REFLEX MICROSCOPIC  I-STAT BETA HCG BLOOD, ED ([facility], [facility], [facility] ONLY)  I-STAT BETA HCG BLOOD, ED ([facility],  [facility], [facility] ONLY)    EKG None  Radiology No results found.  Procedures Procedures    Medications Ordered in ED Medications - No data to display  ED Course/ Medical Decision Making/ A&P                             Medical Decision Making Amount and/or Complexity of Data Reviewed Labs: ordered.   Patient presents with chief complaint of suicidal ideation with hallucinations.  Medical clearance labs ordered.  Labs were grossly unremarkable.  Patient has no complaints physically at this time.  Patient medically cleared.  IVC orders placed.  Paperwork signed by physician and left at [facility] station.  TTS consult placed.        Final Clinical Impression(s) / ED Diagnoses Final diagnoses:  Suicidal ideation    Rx / DC Orders ED Discharge Orders     None         Pamala Duffel 02/06/23 2033    Elayne Snare K, DO 02/07/23 0025

## 2023-02-06 NOTE — ED Triage Notes (Addendum)
Pt c/o suicidal ideation with a plan to take a bunch of pills. Pt states she's having auditory and visual hallucinations. Pt states the hallucination is her ex-boyfriend killed himself.

## 2023-02-06 NOTE — ED Notes (Signed)
IVC paperwork complete and EXP:02/13/23 Faxed to [facility], 1 labeled and filed in [employer] drawer, original located in red magistrate file in triage, 3 copies on purple clipboard in purplre unit with pt.

## 2023-02-06 NOTE — ED Notes (Signed)
Pt belongings placed in locker 5. Security at bedside to wand the pt.  Gave security her phone, cards and airpods in closed envelope.

## 2023-02-06 NOTE — ED Provider Triage Note (Signed)
Emergency Medicine Provider Triage Evaluation Note  Bryna Razavi , a 22 y.o. female  was evaluated in triage.  Pt complains of suicidal ideation with intent to kill self with overdose.  Patient also endorses auditory and visual hallucinations.  She states she has heard voices intentions next.  She states she Has Seen Her Boyfriend kill himself (boyfriend is still alive). Denies other complaints  Review of Systems  Positive: As above Negative: As above  Physical Exam  BP (!) 157/114 (BP Location: Right Arm)   Pulse (!) 102   Temp 97.9 F (36.6 C)   Resp 18   Ht  (1.549 m)   Wt 45.4 kg   SpO2 96%   BMI 18.91 kg/m  Gen:   Awake, no distress   Resp:  Normal effort  MSK:   Moves extremities without difficulty  Other:    Medical Decision Making  Medically screening exam initiated at 6:54 PM.  Appropriate orders placed.  Sharnette Kitamura was informed that the remainder of the evaluation will be completed by another provider, this initial triage assessment does not replace that evaluation, and the importance of remaining in the ED until their evaluation is complete.     Pamala Duffel 02/06/23 1857

## 2023-02-07 ENCOUNTER — Inpatient Hospital Stay (HOSPITAL_COMMUNITY)
Admission: AD | Admit: 2023-02-07 | Discharge: 2023-02-11 | DRG: 885 | Disposition: A | Discharge status: Home / Self Care | Payer: Managed Care, Other (non HMO) | Source: Intra-hospital | Attending: Psychiatry | Admitting: Psychiatry

## 2023-02-07 ENCOUNTER — Encounter (HOSPITAL_COMMUNITY): Payer: Self-pay | Admitting: Psychiatry

## 2023-02-07 DIAGNOSIS — G4700 Insomnia, unspecified: Secondary | ICD-10-CM | POA: Diagnosis present

## 2023-02-07 DIAGNOSIS — F1010 Alcohol abuse, uncomplicated: Secondary | ICD-10-CM | POA: Diagnosis present

## 2023-02-07 DIAGNOSIS — F411 Generalized anxiety disorder: Secondary | ICD-10-CM | POA: Diagnosis present

## 2023-02-07 DIAGNOSIS — F17290 Nicotine dependence, other tobacco product, uncomplicated: Secondary | ICD-10-CM | POA: Diagnosis present

## 2023-02-07 DIAGNOSIS — R000 Tachycardia, unspecified: Secondary | ICD-10-CM | POA: Diagnosis present

## 2023-02-07 DIAGNOSIS — Z62810 Personal history of physical and sexual abuse in childhood: Secondary | ICD-10-CM | POA: Diagnosis not present

## 2023-02-07 DIAGNOSIS — F1210 Cannabis abuse, uncomplicated: Secondary | ICD-10-CM | POA: Diagnosis present

## 2023-02-07 DIAGNOSIS — R45851 Suicidal ideations: Secondary | ICD-10-CM | POA: Diagnosis present

## 2023-02-07 DIAGNOSIS — F312 Bipolar disorder, current episode manic severe with psychotic features: Secondary | ICD-10-CM | POA: Diagnosis present

## 2023-02-07 DIAGNOSIS — F1090 Alcohol use, unspecified, uncomplicated: Secondary | ICD-10-CM | POA: Diagnosis present

## 2023-02-07 DIAGNOSIS — Z91199 Patient's noncompliance with other medical treatment and regimen due to unspecified reason: Secondary | ICD-10-CM | POA: Diagnosis not present

## 2023-02-07 DIAGNOSIS — Z23 Encounter for immunization: Secondary | ICD-10-CM | POA: Diagnosis not present

## 2023-02-07 DIAGNOSIS — F333 Major depressive disorder, recurrent, severe with psychotic symptoms: Secondary | ICD-10-CM | POA: Diagnosis present

## 2023-02-07 DIAGNOSIS — Z79899 Other long term (current) drug therapy: Secondary | ICD-10-CM | POA: Diagnosis not present

## 2023-02-07 DIAGNOSIS — Z5941 Food insecurity: Secondary | ICD-10-CM

## 2023-02-07 DIAGNOSIS — F3110 Bipolar disorder, current episode manic without psychotic features, unspecified: Secondary | ICD-10-CM | POA: Diagnosis present

## 2023-02-07 MED ORDER — HALOPERIDOL 5 MG PO TABS
5.0000 mg | ORAL_TABLET | Freq: Three times a day (TID) | ORAL | Status: DC | PRN
  Administered 2023-02-07: 5 mg via ORAL
  Filled 2023-02-07: qty 1

## 2023-02-07 MED ORDER — LORAZEPAM 1 MG PO TABS
1.0000 mg | ORAL_TABLET | Freq: Three times a day (TID) | ORAL | Status: DC | PRN
  Administered 2023-02-07: 1 mg via ORAL
  Filled 2023-02-07: qty 1

## 2023-02-07 MED ORDER — HYDROXYZINE HCL 25 MG PO TABS
25.0000 mg | ORAL_TABLET | Freq: Three times a day (TID) | ORAL | Status: DC | PRN
  Administered 2023-02-09 – 2023-02-10 (×2): 25 mg via ORAL
  Filled 2023-02-07 (×2): qty 1

## 2023-02-07 MED ORDER — PNEUMOCOCCAL 20-VAL CONJ VACC 0.5 ML IM SUSY
0.5000 mL | PREFILLED_SYRINGE | INTRAMUSCULAR | Status: AC
  Administered 2023-02-09: 0.5 mL via INTRAMUSCULAR
  Filled 2023-02-07: qty 0.5

## 2023-02-07 MED ORDER — LORAZEPAM 2 MG/ML IJ SOLN: 2.0000 mg | Freq: Three times a day (TID) | INTRAMUSCULAR | Status: DC | PRN

## 2023-02-07 MED ORDER — NICOTINE POLACRILEX 2 MG MT GUM
2.0000 mg | CHEWING_GUM | OROMUCOSAL | Status: DC | PRN
  Administered 2023-02-07 – 2023-02-11 (×8): 2 mg via ORAL
  Filled 2023-02-07 (×8): qty 1

## 2023-02-07 MED ORDER — HALOPERIDOL LACTATE 5 MG/ML IJ SOLN: 5.0000 mg | Freq: Three times a day (TID) | INTRAMUSCULAR | Status: DC | PRN

## 2023-02-07 MED ORDER — QUETIAPINE FUMARATE ER 50 MG PO TB24
50.0000 mg | ORAL_TABLET | Freq: Every day | ORAL | Status: DC
  Filled 2023-02-07 (×3): qty 1

## 2023-02-07 MED ORDER — DIPHENHYDRAMINE HCL 50 MG/ML IJ SOLN: 50.0000 mg | Freq: Three times a day (TID) | INTRAMUSCULAR | Status: DC | PRN

## 2023-02-07 MED ORDER — LORAZEPAM 1 MG PO TABS
2.0000 mg | ORAL_TABLET | Freq: Three times a day (TID) | ORAL | Status: DC | PRN
  Administered 2023-02-07: 2 mg via ORAL
  Filled 2023-02-07 (×2): qty 2

## 2023-02-07 MED ORDER — DIPHENHYDRAMINE HCL 25 MG PO CAPS
50.0000 mg | ORAL_CAPSULE | Freq: Three times a day (TID) | ORAL | Status: DC | PRN
  Administered 2023-02-07: 50 mg via ORAL
  Filled 2023-02-07: qty 2

## 2023-02-07 NOTE — Progress Notes (Signed)
Pt was accepted to [facility] TODAY 02/07/2023; Bed Assignment [id number] PENDING signed voluntary consent faxed to [facility] [phone] and repeat K+  Pt meets inpatient criteria per Liborio Nixon, NP  Attending Physician will be Dr. Phineas Inches  Report can be called to:  -Adult unit: [phone]  Pt can arrive after: [facility] will coordinate with care team.  Care Team notified: [facility] Rona Ravens, RN, Liborio Nixon, NP, Donnella Bi, RN, Garvin Fila, RN, Karn Pickler, RN, Meryle Ready, Counselor, Sindy [place]adel[place], NP   Kelton Pillar, LCSWA 02/07/2023 @ 10:37 AM

## 2023-02-07 NOTE — ED Notes (Signed)
Mother at bedside pt sitting up talking with mother NAD calm and cooperative awaiting to be tranfered to [facility]

## 2023-02-07 NOTE — ED Notes (Signed)
[facility] transport called- pt TO [facility] ([facility]) NO ETA [facility]

## 2023-02-07 NOTE — Progress Notes (Signed)
Pt complained of visual and auditory hallucinations. Pt stated she think people are talking about her and are out to get her. Pt is also complaining of dizziness, nausea and chills. Pt BP checked is elevated. Pt given the agitation protocol, pt is trying to go to sleep after taking the medication, but afraid falling a sleep due to night mares. Pt BP rechecked after 1 hour later both automatic and manually, but still elevated.  BP recorded under flowsheet. Will continue to monitor.

## 2023-02-07 NOTE — ED Notes (Signed)
Report received assumed care at this time pt asleep resp eben and non labored will continue to monitor in view of RN station with sitter

## 2023-02-07 NOTE — Tx Team (Signed)
Initial Treatment Plan 02/07/2023 4:28 PM Carolyn York ZOX:[id number]    PATIENT STRESSORS: Educational concerns   Medication change or noncompliance   Substance abuse     PATIENT STRENGTHS: Average or above average intelligence  Capable of independent living  Arboriculturist fund of knowledge  Motivation for treatment/growth  Physical Health  Religious Affiliation  Supportive family/friends    PATIENT IDENTIFIED PROBLEMS: "Drinking every day and blacking out"  "Not taking medications"  "Something's off and something is coming for me"                 DISCHARGE CRITERIA:  Motivation to continue treatment in a less acute level of care Reduction of life-threatening or endangering symptoms to within safe limits  PRELIMINARY DISCHARGE PLAN: Outpatient therapy Return to previous work or school arrangements  PATIENT/FAMILY INVOLVEMENT: This treatment plan has been presented to and reviewed with the patient, Carolyn York.  The patient has been given the opportunity to ask questions and make suggestions.  Karn Pickler, RN 02/07/2023, 4:28 PM

## 2023-02-07 NOTE — ED Provider Notes (Signed)
Emergency Medicine Observation Re-evaluation Note  Carolyn York is a 22 y.o. female, seen on rounds today.  Pt initially presented to the ED for complaints of Suicidal Currently, the patient is asleep.  Physical Exam  BP (!) 134/105 (BP Location: Right Arm)   Pulse 78   Temp (!) 97.4 F (36.3 C) (Oral)   Resp 16   Ht 5\' 1"  (1.549 m)   Wt 45.4 kg   SpO2 100%   BMI 18.91 kg/m  Physical Exam General: asleep Cardiac: asleep Lungs: asleep Psych: asleep  ED Course / MDM  EKG:EKG Interpretation  Date/Time:  Thursday February 06 2023 22:23:43 EDT Ventricular Rate:  80 PR Interval:  140 QRS Duration: 86 QT Interval:  374 QTC Calculation: 431 R Axis:   75 Text Interpretation: Normal sinus rhythm Normal ECG No previous ECGs available no prior ECG for comparison. No STEMI Confirmed by Theda Belfast ([id number]) on 02/07/2023 7:22:02 AM  I have reviewed the labs performed to date as well as medications administered while in observation.  Recent changes in the last 24 hours include psychiatric meds, including PRN ativan this morning.  Plan  Current plan is for inpatient psychiatric treatment.    Pricilla Loveless, MD 02/07/23 [phone]

## 2023-02-07 NOTE — ED Notes (Signed)
Assumed care of patient.

## 2023-02-07 NOTE — Progress Notes (Signed)
Pt meets inpatient behavioral health placement per Sindy Guadeloupe, NP. This CSW has requested 1st shift [facility] Rona Ravens, RN to review pt. CSW will follow up during 1st shift and follow up with care team.   Maryjean Ka, MSW, LCSWA 02/07/2023 1:30 AM

## 2023-02-07 NOTE — Progress Notes (Signed)
Admission Note:   22 y.o. female who presents IVC from [facility] for the treatment of symptoms of mania. Pt is emotionally labile, at times extremely fearful and distressed, but able to calm down with cues. Patient was tearful but calm and cooperative during intake questioning. 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 Patient states she has a job lined up in July in [place], but is under a lot of pressure. Patient expresses religiosity, and requests spiritual consult. Patient claims that "evil spirits come in" when she is drinking and she stopped taking her medications because she was drinking a lot and knows that the two don't mix. Patient was searched and no contraband found, skin assessment was unremarkable. POC and unit policies explained and understanding verbalized. Consents obtained. Food and fluids offered, and fluids accepted. Pt had no additional questions or concerns.

## 2023-02-07 NOTE — Progress Notes (Signed)
CSW requested that IVC paperwork be faxed to [facility] [phone].  Care Team notified: [facility] Rona Ravens, RN, Liborio Nixon, NP, Donnella Bi, RN, Garvin Fila, RN, Karn Pickler, RN, Meryle Ready, Counselor, Sindy Guadeloupe, NP, Dr.Nathan Massengill,MD   Maryjean Ka, MSW, [facility] 02/07/2023 2:46 PM

## 2023-02-07 NOTE — ED Provider Notes (Signed)
Alerted by nursing staff that blood pressures have been running high.  Drug screen negative other than marijuana.  Does not appear to be withdrawing.  Patient exhibiting paranoid behavior and perhaps has some anxiety and agitation.  Will initiate Ativan to see if it helps with her agitation as well as blood pressures.   Gilda Crease, MD 02/07/23 [phone]

## 2023-02-07 NOTE — [facility] Group Notes (Signed)
Adult Psychoeducational Group Note  Date:  02/07/2023 Time:  10:20 PM  Group Topic/Focus:  Wrap-Up Group:  Alcohol Anonymous Group   Participation Level:  Active  Participation Quality:  Appropriate  Affect:  Appropriate  Cognitive:  Appropriate  Insight: Appropriate  Engagement in Group:    Modes of Intervention:  Discussion  Additional Comments:  Pt attended AA group 

## 2023-02-07 NOTE — Progress Notes (Signed)
Pt came back from visitation tearful. When writer asked pt what was wrong, pt stated that they are having issues with hallucinations and can't recognize "what is real of not". Pt asked writer if there was any medication that could help with that. Writer informed pt who their nurse would be for the evening and that they would discuss their evening medications with them. Pt was receptive to this information. RN notified.

## 2023-02-08 DIAGNOSIS — G4700 Insomnia, unspecified: Secondary | ICD-10-CM | POA: Diagnosis present

## 2023-02-08 DIAGNOSIS — F3110 Bipolar disorder, current episode manic without psychotic features, unspecified: Secondary | ICD-10-CM | POA: Diagnosis present

## 2023-02-08 DIAGNOSIS — F312 Bipolar disorder, current episode manic severe with psychotic features: Principal | ICD-10-CM | POA: Diagnosis present

## 2023-02-08 DIAGNOSIS — F1090 Alcohol use, unspecified, uncomplicated: Secondary | ICD-10-CM | POA: Diagnosis present

## 2023-02-08 DIAGNOSIS — F411 Generalized anxiety disorder: Secondary | ICD-10-CM | POA: Diagnosis present

## 2023-02-08 DIAGNOSIS — F1210 Cannabis abuse, uncomplicated: Secondary | ICD-10-CM | POA: Diagnosis present

## 2023-02-08 MED ORDER — DIPHENHYDRAMINE HCL 50 MG/ML IJ SOLN: 25.0000 mg | Freq: Three times a day (TID) | INTRAMUSCULAR | Status: DC | PRN

## 2023-02-08 MED ORDER — LORAZEPAM 2 MG/ML IJ SOLN: 1.0000 mg | Freq: Three times a day (TID) | INTRAMUSCULAR | Status: DC | PRN

## 2023-02-08 MED ORDER — LORAZEPAM 1 MG PO TABS: 1.0000 mg | ORAL_TABLET | Freq: Every day | ORAL | Status: DC

## 2023-02-08 MED ORDER — LORAZEPAM 1 MG PO TABS
1.0000 mg | ORAL_TABLET | Freq: Three times a day (TID) | ORAL | Status: AC
  Administered 2023-02-09 – 2023-02-10 (×3): 1 mg via ORAL
  Filled 2023-02-08 (×3): qty 1

## 2023-02-08 MED ORDER — LORAZEPAM 1 MG PO TABS
1.0000 mg | ORAL_TABLET | Freq: Four times a day (QID) | ORAL | Status: AC
  Administered 2023-02-08 – 2023-02-09 (×4): 1 mg via ORAL
  Filled 2023-02-08 (×2): qty 1

## 2023-02-08 MED ORDER — VITAMIN B-1 100 MG PO TABS
100.0000 mg | ORAL_TABLET | Freq: Every day | ORAL | Status: DC
  Administered 2023-02-09 – 2023-02-11 (×3): 100 mg via ORAL
  Filled 2023-02-08 (×4): qty 1

## 2023-02-08 MED ORDER — THIAMINE HCL 100 MG/ML IJ SOLN
100.0000 mg | Freq: Once | INTRAMUSCULAR | Status: AC
  Administered 2023-02-08: 100 mg via INTRAMUSCULAR
  Filled 2023-02-08: qty 2

## 2023-02-08 MED ORDER — ADULT MULTIVITAMIN W/MINERALS CH
1.0000 | ORAL_TABLET | Freq: Every day | ORAL | Status: DC
  Administered 2023-02-08 – 2023-02-11 (×4): 1 via ORAL
  Filled 2023-02-08 (×6): qty 1

## 2023-02-08 MED ORDER — DIVALPROEX SODIUM ER 250 MG PO TB24
250.0000 mg | ORAL_TABLET | Freq: Every day | ORAL | Status: AC
  Administered 2023-02-08 – 2023-02-10 (×3): 250 mg via ORAL
  Filled 2023-02-08 (×3): qty 1

## 2023-02-08 MED ORDER — LOPERAMIDE HCL 2 MG PO CAPS: 2.0000 mg | ORAL_CAPSULE | ORAL | Status: DC | PRN

## 2023-02-08 MED ORDER — LORAZEPAM 1 MG PO TABS: 1.0000 mg | ORAL_TABLET | Freq: Three times a day (TID) | ORAL | Status: DC | PRN

## 2023-02-08 MED ORDER — PALIPERIDONE ER 1.5 MG PO TB24
1.5000 mg | ORAL_TABLET | Freq: Every day | ORAL | Status: DC
  Administered 2023-02-08 – 2023-02-09 (×2): 1.5 mg via ORAL
  Filled 2023-02-08 (×5): qty 1

## 2023-02-08 MED ORDER — DIVALPROEX SODIUM ER 500 MG PO TB24
500.0000 mg | ORAL_TABLET | Freq: Every day | ORAL | Status: DC
  Filled 2023-02-08: qty 1

## 2023-02-08 MED ORDER — ONDANSETRON 4 MG PO TBDP: 4.0000 mg | ORAL_TABLET | Freq: Four times a day (QID) | ORAL | Status: DC | PRN

## 2023-02-08 MED ORDER — LORAZEPAM 1 MG PO TABS
1.0000 mg | ORAL_TABLET | Freq: Two times a day (BID) | ORAL | Status: AC
  Administered 2023-02-10 – 2023-02-11 (×2): 1 mg via ORAL
  Filled 2023-02-08 (×2): qty 1

## 2023-02-08 MED ORDER — DIPHENHYDRAMINE HCL 25 MG PO CAPS: 25.0000 mg | ORAL_CAPSULE | Freq: Three times a day (TID) | ORAL | Status: DC | PRN

## 2023-02-08 NOTE — Group Note (Signed)
Date:  02/08/2023 Time:  6:30 PM  Group Topic/Focus:  Social Wellness (Self and Interpersonal)    Participation Level:  Active  Participation Quality:  Appropriate  Affect:  Appropriate  Cognitive:  Appropriate  Insight: Appropriate  Engagement in Group:  Engaged  Modes of Intervention:  Activity  Additional Comments:   Pt attended and participated in the Social Wellness group.  Odesser Tourangeau M Marta Bouie 02/08/2023, 6:30 PM  

## 2023-02-08 NOTE — Group Note (Signed)
Date:  02/08/2023 Time:  10:32 AM  Group Topic/Focus:  Goals Group:   The focus of this group is to help patients establish daily goals to achieve during treatment and discuss how the patient can incorporate goal setting into their daily lives to aide in recovery. Orientation:   The focus of this group is to educate the patient on the purpose and policies of crisis stabilization and provide a format to answer questions about their admission.  The group details unit policies and expectations of patients while admitted.    Participation Level:  Active  Participation Quality:  Appropriate  Affect:  Appropriate  Cognitive:  Appropriate  Insight: Appropriate  Engagement in Group:  Engaged  Modes of Intervention:  Discussion, Orientation, and Rapport Building  Additional Comments:   Pt attended and participated in the Orientation/Goals group. Edmund Hilda Joice Nazario 02/08/2023, 10:32 AM

## 2023-02-08 NOTE — Group Note (Deleted)
Date:  02/08/2023 Time:  6:36 PM  Group Topic/Focus:  Emotional Wellness     Participation Level:  {[facility] PARTICIPATION LEVEL:22264}  Participation Quality:  {[facility] PARTICIPATION QUALITY:22265}  Affect:  {[facility] AFFECT:22266}  Cognitive:  {[facility] COGNITIVE:22267}  Insight: {BHH Insight2:20797}  Engagement in Group:  {[facility] ENGAGEMENT IN GROUP:22268}  Modes of Intervention:  {[facility] MODES OF INTERVENTION:22269}  Additional Comments:  ***  Shaianne Nucci M Clare Casto 02/08/2023, 6:36 PM  

## 2023-02-08 NOTE — H&P (Cosign Needed Addendum)
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 medication management/psychotherapy  I certify that inpatient services furnished can reasonably be expected to improve the patient's condition.    Starleen Blue, NP 4/27/20244:38 PM

## 2023-02-08 NOTE — Group Note (Signed)
Date:  02/08/2023 Time:  5:26 PM  Group Topic/Focus:  Emotional Education:   The focus of this group is to discuss what feelings/emotions are, and how they are experienced.    Participation Level:  Did Not Attend  Participation Quality:   n/a  Affect:   n/a  Cognitive:   n/a  Insight: None  Engagement in Group:   n/a  Modes of Intervention:   n/a  Additional Comments:   Pt did not attend.  Edmund Hilda Shawntay Prest 02/08/2023, 5:26 PM

## 2023-02-08 NOTE — Progress Notes (Signed)
Adult Psychoeducational Group Note  Date:  02/08/2023 Time:  9:06 PM  Group Topic/Focus:  Wrap-Up Group:   The focus of this group is to help patients review their daily goal of treatment and discuss progress on daily workbooks.  Participation Level:  Active  Participation Quality:  Appropriate  Affect:  Appropriate  Cognitive:  Appropriate  Insight: Appropriate  Engagement in Group:  Engaged  Modes of Intervention:  Discussion  Additional Comments:    Chauncey Fischer 02/08/2023, 9:06 PM
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
## 2023-02-08 NOTE — Progress Notes (Signed)
Pt alert and oriented in a spheres. Pt endorses auditory and visual hallucinations stating she sees God and the Devil and one is telling her to do bad things and the other one not to do it. Pt denies suicidal thoughts. Pt states she was drinking alcohol daily prior to admission. Pt denies withdrawal symptoms at this time. Pt tolerating food and fluid. Q 15 minute checks ongoing for safety.

## 2023-02-08 NOTE — Progress Notes (Signed)
   02/08/23 0100  Psych Admission Type (Psych Patients Only)  Admission Status Involuntary  Psychosocial Assessment  Patient Complaints Anxiety  Eye Contact Fair  Facial Expression Worried  Affect Appropriate to circumstance  Speech Logical/coherent  Interaction Assertive  Motor Activity Slow  Appearance/Hygiene Unremarkable  Behavior Characteristics Cooperative;Appropriate to situation  Mood Anxious;Depressed  Thought Process  Coherency Circumstantial  Content Paranoia;Preoccupation  Delusions Paranoid  Perception Hallucinations  Hallucination Auditory  Judgment Poor  Confusion None  Danger to Self  Current suicidal ideation? Denies  Danger to Others  Danger to Others None reported or observed

## 2023-02-08 NOTE — Progress Notes (Signed)
   02/08/23 2200  Psych Admission Type (Psych Patients Only)  Admission Status Involuntary  Psychosocial Assessment  Patient Complaints Anxiety  Eye Contact Fair  Facial Expression Worried  Affect Appropriate to circumstance  Speech Logical/coherent  Interaction Assertive  Motor Activity Slow  Appearance/Hygiene Unremarkable  Behavior Characteristics Cooperative;Appropriate to situation  Mood Pleasant  Thought Process  Coherency Circumstantial  Content Paranoia;Preoccupation  Delusions Paranoid  Perception Hallucinations  Hallucination Auditory  Judgment Poor  Confusion None  Danger to Self  Current suicidal ideation? Denies  Danger to Others  Danger to Others None reported or observed

## 2023-02-08 NOTE — Group Note (Signed)
Date:  02/08/2023 Time:  5:10 PM  Group Topic/Focus:  Intellectual Wellness    Participation Level:  Active  Participation Quality:  Appropriate  Affect:  Appropriate  Cognitive:  Appropriate  Insight: Appropriate  Engagement in Group:  Engaged  Modes of Intervention:  Discussion and Support  Additional Comments:   Pt attended and participated in the Intellectual Wellness group.  Edmund Hilda Cotey Rakes 02/08/2023, 5:10 PM

## 2023-02-09 LAB — LIPID PANEL
Cholesterol: 162 mg/dL (ref 0–200)
HDL: 56 mg/dL (ref >40)
LDL Cholesterol: 92 mg/dL (ref 0–99)
Total CHOL/HDL Ratio: 2.9 RATIO
Triglycerides: 69 mg/dL (ref <150)
VLDL: 14 mg/dL (ref 0–40)

## 2023-02-09 LAB — URINALYSIS, ROUTINE W REFLEX MICROSCOPIC
Bacteria, UA: NONE SEEN
Bilirubin Urine: NEGATIVE
Glucose, UA: NEGATIVE mg/dL
Hgb urine dipstick: NEGATIVE
Ketones, ur: NEGATIVE mg/dL
Leukocytes,Ua: NEGATIVE
Nitrite: NEGATIVE
Protein, ur: 30 mg/dL — AB
Specific Gravity, Urine: 1.03 (ref 1.005–1.030)
pH: 5 (ref 5.0–8.0)

## 2023-02-09 LAB — VITAMIN D 25 HYDROXY (VIT D DEFICIENCY, FRACTURES): Vit D, 25-Hydroxy: 13.29 ng/mL — ABNORMAL LOW (ref 30–100)

## 2023-02-09 LAB — HEMOGLOBIN A1C
Hgb A1c MFr Bld: 4.9 % (ref 4.8–5.6)
Mean Plasma Glucose: 93.93 mg/dL

## 2023-02-09 LAB — TSH: TSH: 1.787 u[IU]/mL (ref 0.350–4.500)

## 2023-02-09 MED ORDER — VITAMIN D (ERGOCALCIFEROL) 1.25 MG (50000 UNIT) PO CAPS
50000.0000 [IU] | ORAL_CAPSULE | ORAL | Status: DC
  Administered 2023-02-09: 50000 [IU] via ORAL
  Filled 2023-02-09 (×2): qty 1

## 2023-02-09 MED ORDER — PALIPERIDONE ER 3 MG PO TB24
3.0000 mg | ORAL_TABLET | Freq: Every day | ORAL | Status: DC
  Administered 2023-02-10 – 2023-02-11 (×2): 3 mg via ORAL
  Filled 2023-02-09 (×3): qty 1

## 2023-02-09 MED ORDER — IBUPROFEN 400 MG PO TABS
400.0000 mg | ORAL_TABLET | Freq: Four times a day (QID) | ORAL | Status: DC | PRN
  Administered 2023-02-09: 400 mg via ORAL
  Filled 2023-02-09: qty 1

## 2023-02-09 NOTE — Progress Notes (Signed)
   02/09/23 2100  Psych Admission Type (Psych Patients Only)  Admission Status Involuntary  Psychosocial Assessment  Patient Complaints Anxiety  Eye Contact Fair  Facial Expression Worried  Affect Appropriate to circumstance  Speech Logical/coherent  Interaction Assertive  Motor Activity Slow  Appearance/Hygiene Unremarkable  Behavior Characteristics Cooperative;Appropriate to situation  Mood Anxious;Depressed  Thought Process  Coherency Circumstantial  Content Paranoia;Preoccupation  Delusions Paranoid  Perception Hallucinations  Hallucination Auditory  Judgment Poor  Confusion None  Danger to Self  Current suicidal ideation? Denies  Danger to Others  Danger to Others None reported or observed

## 2023-02-09 NOTE — [facility] Group Notes (Signed)
Adult Psychoeducational Group Note  Date:  02/09/2023 Time:  8:30 PM  Group Topic/Focus:  Wrap-Up Group:   The focus of this group is to help patients review their daily goal of treatment and discuss progress on daily workbooks.  Participation Level:  Active  Participation Quality:  Appropriate  Affect:  Appropriate  Cognitive:  Appropriate  Insight: Appropriate  Engagement in Group:  Engaged  Modes of Intervention:  Discussion  Additional Comments:  Pt attended wrap-up group. Pt was able to rate their day, describe something positive today and set goal for tomorrow.   

## 2023-02-09 NOTE — [facility] Group Notes (Signed)
[facility] Group Notes:  (Nursing/MHT/Case Management/Adjunct)  Date:  02/09/2023  Time:  9:35 AM  Type of Therapy:   Goals group  Participation Level:  Active  Participation Quality:  Appropriate  Affect:  Appropriate  Cognitive:  Appropriate  Insight:  Good  Engagement in Group:  Engaged  Modes of Intervention:  Discussion and Orientation  Summary of Progress/Problems: Goal is to have no hullicinations  Azalee Course 02/09/2023, 9:35 AM

## 2023-02-09 NOTE — Progress Notes (Signed)
Patient denies SI, HI, and A/V/H with no plan or intent. Patient states her depression is a 8 and anxiety a 6 out of 10. Patient states ativan has been helping with her anxiety and helping to reduce irritability. Patient appears concerned about her assignments that are due before graduation and appears persistent on discharging. Patient explained discharge process. Patient remains cooperative on unit and socializing appropriately.

## 2023-02-09 NOTE — Progress Notes (Signed)
[facility] MD Progress Note  02/09/2023 1:07 PM Carolyn York  MRN:  [id number] Principal Problem: Bipolar affective disorder, currently manic, severe, with psychotic features (HCC) Diagnosis: Principal Problem:   Bipolar affective disorder, currently manic, severe, with psychotic features (HCC) Active Problems:   GAD (generalized anxiety disorder)   Insomnia   Alcohol use disorder   Tetrahydrocannabinol (THC) use disorder, mild, abuse  Reason For Admission: Carolyn York is a 22 yo female with a prior mental health diagnosis of bipolar who presented to the [facility] ER with +SI with plan to overdose & psychosis; reported VH of her boyfriend killing himself & +AH of voices stating that she is next to die. The above was in the context of medication non adherence and ETOH & THC abuse. Pt was involuntarily committed prior to being transferred to this [facility] for treatment and stabilization of her mental status.   24 hour chart review: Sleep Hours last night: None documented, reports a good sleep quality Nursing Concerns: None reported  Behavioral episodes in the past 24 hrs: Medication Compliance: Compliant  Vital Signs in the past 24 hrs: Assigned RN notified to check vitals PRN Medications in the past 24 hrs: Ibuprofen, Nicorette gum.  Patient assessment note: Pt with mood improvement noted today as compared to yesterday. She is less anxious, calmer, more logical, more coherent today. Her attention to personal hygiene and grooming is good, which is also an improvement. Her eye contact is good, speech is clear & coherent. Thought contents are organized and logical, and pt currently denies SI/HI/AVH or paranoia. There is no evidence of delusional thoughts.  Pt reports that her sleep quality last night was good, reports a good appetite, reports menstrual cramping related to her menses. Orders placed for Ibuprofen PRN as she states that this medication is typically effective.  Pt is tolerating being  on the Ativan taper for alcohol use, denies medication related side effects. We are increasing Invega to 3 mg daily starting with tomorrow's dose, and keeping all other medications the same. Pt is on track to graduate from the [employer] on 5/12, and is inquiring if she will be discharged prior to that date, and has been made aware that CSW will be notified to reach out to her guidance counselor to notify of her admission if she would like for [place] to do so. She has however been made aware that she will be discharged prior to her graduation date. We will increase Depakote to 500 mg nightly on 4/30, and draw a level on same day.  Total Time spent with patient: 45 minutes  Past Psychiatric History: See H & P  Past Medical History:  Past Medical History:  Diagnosis Date   Bipolar disorder [facility])     Past Surgical History:  Procedure Laterality Date   WISDOM TOOTH EXTRACTION     Family History: History reviewed. No pertinent family history. Family Psychiatric  History: See H & P Social History:  Social History   Substance and Sexual Activity  Alcohol Use Yes   Comment: 1/5 liquor a week     Social History   Substance and Sexual Activity  Drug Use Not Currently   Types: Marijuana    Social History   Socioeconomic History   Marital status: Single    Spouse name: Not on file   Number of children: Not on file   Years of education: Not on file   Highest education level: Not on file  Occupational History  Not on file  Tobacco Use   Smoking status: Never   Smokeless tobacco: Never  Vaping Use   Vaping Use: Every day  Substance and Sexual Activity   Alcohol use: Yes    Comment: 1/5 liquor a week   Drug use: Not Currently    Types: Marijuana   Sexual activity: Not on file  Other Topics Concern   Not on file  Social History Narrative   Not on file   Social Determinants of Health   Financial Resource Strain: Not on file  Food Insecurity: Food Insecurity Present  (02/07/2023)   Hunger Vital Sign    Worried About Running Out of Food in the Last Year: Sometimes true    Ran Out of Food in the Last Year: Sometimes true  Transportation Needs: No Transportation Needs (02/07/2023)   PRAPARE - Administrator, Civil Service (Medical): No    Lack of Transportation (Non-Medical): No  Physical Activity: Not on file  Stress: Not on file  Social Connections: Not on file   Sleep: Good  Appetite:  Good  Current Medications: Current Facility-Administered Medications  Medication Dose Route Frequency Provider Last Rate Last Admin   diphenhydrAMINE (BENADRYL) capsule 25 mg  25 mg Oral TID PRN Starleen Blue, NP       Or   diphenhydrAMINE (BENADRYL) injection 25 mg  25 mg Intramuscular TID PRN Starleen Blue, NP       divalproex (DEPAKOTE ER) 24 hr tablet 250 mg  250 mg Oral Daily Starleen Blue, NP   250 mg at 02/08/23 2103   Followed by   Melene Muller ON 02/11/2023] divalproex (DEPAKOTE ER) 24 hr tablet 500 mg  500 mg Oral Daily Bryn Perkin, NP       haloperidol (HALDOL) tablet 5 mg  5 mg Oral TID PRN Liborio Nixon L, NP   5 mg at 02/07/23 1953   Or   haloperidol lactate (HALDOL) injection 5 mg  5 mg Intramuscular TID PRN White, Patrice L, NP       hydrOXYzine (ATARAX) tablet 25 mg  25 mg Oral Q8H PRN White, Patrice L, NP       ibuprofen (ADVIL) tablet 400 mg  400 mg Oral Q6H PRN Starleen Blue, NP   400 mg at 02/09/23 1212   loperamide (IMODIUM) capsule 2-4 mg  2-4 mg Oral PRN Starleen Blue, NP       LORazepam (ATIVAN) tablet 1 mg  1 mg Oral TID PRN Starleen Blue, NP       Or   LORazepam (ATIVAN) injection 1 mg  1 mg Intramuscular TID PRN Starleen Blue, NP       LORazepam (ATIVAN) tablet 1 mg  1 mg Oral TID Starleen Blue, NP   1 mg at 02/09/23 1212   Followed by   Melene Muller ON 02/10/2023] LORazepam (ATIVAN) tablet 1 mg  1 mg Oral BID Starleen Blue, NP       Followed by   Melene Muller ON 02/12/2023] LORazepam (ATIVAN) tablet 1 mg  1 mg Oral Daily Jaleeyah Munce,  Rider Ermis, NP       multivitamin with minerals tablet 1 tablet  1 tablet Oral Daily Starleen Blue, NP   1 tablet at 02/09/23 0834   nicotine polacrilex (NICORETTE) gum 2 mg  2 mg Oral PRN Phineas Inches, MD   2 mg at 02/09/23 0836   ondansetron (ZOFRAN-ODT) disintegrating tablet 4 mg  4 mg Oral Q6H PRN Starleen Blue, NP       Melene Muller  ON 02/10/2023] paliperidone (INVEGA) 24 hr tablet 3 mg  3 mg Oral Daily Analisia Kingsford, NP       pneumococcal 20-valent conjugate vaccine (PREVNAR 20) injection 0.5 mL  0.5 mL Intramuscular Tomorrow-1000 Massengill, Nathan, MD       thiamine (Vitamin B-1) tablet 100 mg  100 mg Oral Daily Starleen Blue, NP   100 mg at 02/09/23 1610   Vitamin D (Ergocalciferol) (DRISDOL) 1.25 MG (50000 UNIT) capsule 50,000 Units  50,000 Units Oral Q7 days Starleen Blue, NP        Lab Results:  Results for orders placed or performed during the hospital encounter of 02/07/23 (from the past 48 hour(s))  Urinalysis, Routine w reflex microscopic -Urine, Clean Catch     Status: Abnormal   Collection Time: 02/08/23  4:38 PM  Result Value Ref Range   Color, Urine YELLOW YELLOW   APPearance TURBID (A) CLEAR   Specific Gravity, Urine 1.030 1.005 - 1.030   pH 5.0 5.0 - 8.0   Glucose, UA NEGATIVE NEGATIVE mg/dL   Hgb urine dipstick NEGATIVE NEGATIVE   Bilirubin Urine NEGATIVE NEGATIVE   Ketones, ur NEGATIVE NEGATIVE mg/dL   Protein, ur 30 (A) NEGATIVE mg/dL   Nitrite NEGATIVE NEGATIVE   Leukocytes,Ua NEGATIVE NEGATIVE   RBC / HPF 0-5 0 - 5 RBC/hpf   WBC, UA 0-5 0 - 5 WBC/hpf   Bacteria, UA NONE SEEN NONE SEEN   Squamous Epithelial / HPF 0-5 0 - 5 /HPF   Mucus PRESENT    Amorphous Crystal PRESENT     Comment: Performed at [facility], [address] [place], [place] [address]  TSH     Status: None   Collection Time: 02/09/23  6:24 AM  Result Value Ref Range   TSH 1.787 0.350 - 4.500 uIU/mL    Comment: Performed by a 3rd Generation assay with a functional  sensitivity of <=0.01 uIU/mL. Performed at [facility], [address] [place], [place] [address]   Hemoglobin A1c     Status: None   Collection Time: 02/09/23  6:24 AM  Result Value Ref Range   Hgb A1c MFr Bld 4.9 4.8 - 5.6 %    Comment: (NOTE) Pre diabetes:          5.7%-6.4%  Diabetes:              >6.4%  Glycemic control for   <7.0% adults with diabetes    Mean Plasma Glucose 93.93 mg/dL    Comment: Performed at [facility], [address], [place], [place] [address]  Lipid panel     Status: None   Collection Time: 02/09/23  6:24 AM  Result Value Ref Range   Cholesterol 162 0 - 200 mg/dL   Triglycerides 69 <829 mg/dL   HDL 56 >56 mg/dL   Total CHOL/HDL Ratio 2.9 RATIO   VLDL 14 0 - 40 mg/dL   LDL Cholesterol 92 0 - 99 mg/dL    Comment:        Total Cholesterol/HDL:CHD Risk Coronary Heart Disease Risk Table                     Men   Women  1/2 Average Risk   3.4   3.3  Average Risk       5.0   4.4  2 X Average Risk   9.6   7.1  3 X Average Risk  23.4   11.0  Use the calculated Patient Ratio above and the CHD Risk Table to determine the patient's CHD Risk.        ATP III CLASSIFICATION (LDL):  <100     mg/dL   Optimal  161-096  mg/dL   Near or Above                    Optimal  130-159  mg/dL   Borderline  045-409  mg/dL   High  >811     mg/dL   Very High Performed at [facility], [address] [place], [place] [address]   VITAMIN D 25 Hydroxy (Vit-D Deficiency, Fractures)     Status: Abnormal   Collection Time: 02/09/23  6:24 AM  Result Value Ref Range   Vit D, 25-Hydroxy 13.29 (L) 30 - 100 ng/mL    Comment: (NOTE) Vitamin D deficiency has been defined by the Institute of Medicine  and an Endocrine Society practice guideline as a level of serum 25-OH  vitamin D less than 20 ng/mL (1,2). The Endocrine Society went on to  further define vitamin D insufficiency as a level between 21 and 29  ng/mL  (2).  1. IOM (Institute of Medicine). 2010. Dietary reference intakes for  calcium and D. [place]: [employer]. 2. Holick MF, Binkley Brewerton, Bischoff-Ferrari HA, et al. Evaluation,  treatment, and prevention of vitamin D deficiency: an Endocrine  Society clinical practice guideline, JCEM. 2011 Jul; 96(7): 1911-30.  Performed at [facility], [address], [place], [place] [address]     Blood Alcohol level:  Lab Results  Component Value Date   ETH <10 02/06/2023   ETH 372 (HH) 09/17/2021    Metabolic Disorder Labs: Lab Results  Component Value Date   HGBA1C 4.9 02/09/2023   MPG 93.93 02/09/2023   No results found for: "PROLACTIN" Lab Results  Component Value Date   CHOL 162 02/09/2023   TRIG 69 02/09/2023   HDL 56 02/09/2023   CHOLHDL 2.9 02/09/2023   VLDL 14 02/09/2023   LDLCALC 92 02/09/2023    Physical Findings: AIMS: 0 CIWA:  CIWA-Ar Total: 3 COWS:  n/a  Musculoskeletal: Strength & Muscle Tone: within normal limits Gait & Station: normal Patient leans: N/A  Psychiatric Specialty Exam:  Presentation  General Appearance:  Appropriate for Environment; Fairly Groomed  Eye Contact: Good  Speech: Clear and Coherent  Speech Volume: Normal  Handedness: Right   Mood and Affect  Mood: Depressed; Anxious  Affect: Congruent   Thought Process  Thought Processes: Coherent  Descriptions of Associations:Intact  Orientation:Full (Time, Place and Person)  Thought Content:Logical  History of Schizophrenia/Schizoaffective disorder:No  Duration of Psychotic Symptoms:Greater than six months  Hallucinations:Hallucinations: None  Ideas of Reference:None  Suicidal Thoughts:Suicidal Thoughts: No  Homicidal Thoughts:Homicidal Thoughts: No  Sensorium  Memory: Immediate Good  Judgment: Good  Insight: Good  Executive Functions  Concentration: Good  Attention Span: Good  Recall: Good  Fund of  Knowledge: Good  Language: Good  Psychomotor Activity  Psychomotor Activity: Psychomotor Activity: Normal  Assets  Assets: Communication Skills; Social Support; Resilience  Sleep  Sleep: Sleep: Good  Physical Exam: Physical Exam Constitutional:      Appearance: Normal appearance.  HENT:     Head: Normocephalic.     Nose: No congestion or rhinorrhea.  Eyes:     Pupils: Pupils are equal, round, and reactive to light.  Musculoskeletal:        General: Normal range  of motion.     Cervical back: Normal range of motion.  Neurological:     Mental Status: She is alert and oriented to person, place, and time.  Psychiatric:        Thought Content: Thought content normal.    Review of Systems  Constitutional:  Negative for fever.  HENT:  Negative for hearing loss.   Eyes:  Negative for blurred vision.  Respiratory:  Negative for cough.   Cardiovascular:  Negative for chest pain.  Gastrointestinal:  Negative for heartburn.  Genitourinary:  Negative for dysuria.  Musculoskeletal:  Negative for myalgias.  Skin:  Negative for rash.  Neurological:  Negative for dizziness.  Psychiatric/Behavioral:  Positive for depression, hallucinations and substance abuse. Negative for memory loss and suicidal ideas. The patient is nervous/anxious and has insomnia.    Blood pressure (!) 133/99, pulse 88, temperature 98.2 F (36.8 C), temperature source Oral, resp. rate 18, height 5' 0.5" (1.537 m), weight 40.6 kg, last menstrual period 02/07/2023, SpO2 100 %. Body mass index is 17.21 kg/m.  Treatment Plan Summary: Daily contact with patient to assess and evaluate symptoms and progress in treatment and Medication management  Treatment Plan Summary: Daily contact with patient to assess and evaluate symptoms and progress in treatment and Medication management   Observation Level/Precautions:  15 minute checks  Laboratory:  Labs reviewed: Vitamin D low at 13.29, supplementing with 50.000  units weekly.  EKG with QTC WNL.   Psychotherapy:  Unit Group sessions  Medications:  See [facility]  Consultations:  To be determined   Discharge Concerns:  Safety, medication compliance, mood stability  Estimated LOS: 5-7 days  Other:  N/A    PLAN Safety and Monitoring: Voluntary admission to inpatient psychiatric unit for safety, stabilization and treatment Daily contact with patient to assess and evaluate symptoms and progress in treatment Patient's case to be discussed in multi-disciplinary team meeting Observation Level : q15 minute checks Vital signs: q12 hours Precautions: Safety   Long Term Goal(s): Improvement in symptoms so as ready for discharge   Short Term Goals: Ability to identify changes in lifestyle to reduce recurrence of condition will improve, Ability to verbalize feelings will improve, Ability to disclose and discuss suicidal ideas, Ability to identify and develop effective coping behaviors will improve, Ability to maintain clinical measurements within normal limits will improve, Compliance with prescribed medications will improve, and Ability to identify triggers associated with substance abuse/mental health issues will improve   Diagnoses  Principal Problem:   Bipolar affective disorder, currently manic, severe, with psychotic features (HCC) Active Problems:   GAD (generalized anxiety disorder)   Insomnia   Alcohol use disorder   Tetrahydrocannabinol (THC) use disorder, mild, abuse   Medications -Increase Invega from 1.5 mg to 3 mg daily for psychosis/mood stabilization starting 4/29. -Start Vitamin D 50.000 units weekly for low Vit D of 13.29 -Continue Ativan taper for alcohol use d/o-See MAR for complete order -Continue Hydroxyzine 25 mg TID PRN for anxiety -Continue Depakote 250 mg x 3 doses, and increase to 500 mg nightly for mood stabilization on 4/30 -Continue Agitation Protocol Medications as follows: Benadryl 25 mg PRN, Ativan 1 mg PRN & Continue Haldol 5  mg PRN-See MAR for complete order   Other PRNS -Continue Tylenol 650 mg every 6 hours PRN for mild pain -Continue Maalox 30 mg every 4 hrs PRN for indigestion -Continue Imodium 2-4 mg as needed for diarrhea -Continue Milk of Magnesia as needed every 6 hrs for constipation -Continue Zofran  disintegrating tabs every 6 hrs PRN for nausea    Discharge Planning: Social work and case management to assist with discharge planning and identification of hospital follow-up needs prior to discharge Estimated LOS: 5-7 days Discharge Concerns: Need to establish a safety plan; Medication compliance and effectiveness Discharge Goals: Return home with outpatient referrals for mental health follow-up including medication management/psychotherapy   I certify that inpatient services furnished can reasonably be expected to improve the patient's condition.      Starleen Blue, NP 02/09/2023, 1:07 PM

## 2023-02-09 NOTE — [facility] Group Notes (Signed)
Group Topic/Focus:  Emotional Education:   The focus of this group is to discuss what feelings/emotions are, and how they are experienced.  Participation Level:  Active  Participation Quality:  Appropriate  Affect:  Appropriate  Cognitive:  Appropriate  Insight: Appropriate  Engagement in Group:  Engaged  Modes of Intervention:  Discussion  Additional Comments:  Pt participated in emotional wellness group. Pt was able to practice guided mediation to promote emotional wellness.  

## 2023-02-09 NOTE — Plan of Care (Signed)
  Problem: Health Behavior/Discharge Planning: Goal: Compliance with treatment plan for underlying cause of condition will improve Outcome: Progressing   Problem: Safety: Goal: Periods of time without injury will increase Outcome: Progressing   

## 2023-02-10 ENCOUNTER — Other Ambulatory Visit (HOSPITAL_COMMUNITY): Payer: Self-pay

## 2023-02-10 ENCOUNTER — Encounter (HOSPITAL_COMMUNITY): Payer: Self-pay

## 2023-02-10 MED ORDER — PALIPERIDONE PALMITATE ER 156 MG/ML IM SUSY
156.0000 mg | PREFILLED_SYRINGE | Freq: Once | INTRAMUSCULAR | Status: AC
  Administered 2023-02-10: 156 mg via INTRAMUSCULAR

## 2023-02-10 MED ORDER — PALIPERIDONE PALMITATE ER 117 MG/0.75ML IM SUSY: 117.0000 mg | PREFILLED_SYRINGE | Freq: Once | INTRAMUSCULAR | Status: DC

## 2023-02-10 MED ORDER — PROPRANOLOL HCL 10 MG PO TABS
10.0000 mg | ORAL_TABLET | Freq: Two times a day (BID) | ORAL | Status: DC
  Administered 2023-02-10 – 2023-02-11 (×2): 10 mg via ORAL
  Filled 2023-02-10 (×5): qty 1

## 2023-02-10 NOTE — Group Note (Signed)
Occupational Therapy Group Note  Group Topic:Coping Skills  Group Date: 02/10/2023 Start Time: 1430 End Time: 1500 Facilitators: Anjolaoluwa Siguenza G, OT   Group Description: Group encouraged increased engagement and participation through discussion and activity focused on "Coping Ahead." Patients were split up into teams and selected a card from a stack of positive coping strategies. Patients were instructed to act out/charade the coping skill for other peers to guess and receive points for their team. Discussion followed with a focus on identifying additional positive coping strategies and patients shared how they were going to cope ahead over the weekend while continuing hospitalization stay.  Therapeutic Goal(s): Identify positive vs negative coping strategies. Identify coping skills to be used during hospitalization vs coping skills outside of hospital/at home Increase participation in therapeutic group environment and promote engagement in treatment   Participation Level: Engaged   Participation Quality: Independent   Behavior: Appropriate   Speech/Thought Process: Relevant   Affect/Mood: Appropriate   Insight: Fair   Judgement: Fair      Modes of Intervention: Education  Patient Response to Interventions:  Attentive   Plan: Continue to engage patient in OT groups 2 - 3x/week.  02/10/2023  Jahna Liebert G Davine Coba, OT Jalon Squier, OT   

## 2023-02-10 NOTE — Progress Notes (Signed)
   02/10/23 1000  Psych Admission Type (Psych Patients Only)  Admission Status Involuntary  Psychosocial Assessment  Patient Complaints Anxiety  Eye Contact Fair  Facial Expression Animated;Anxious  Affect Appropriate to circumstance  Speech Logical/coherent  Interaction Assertive  Motor Activity Other (Comment) (wdl)  Appearance/Hygiene Unremarkable  Behavior Characteristics Cooperative;Appropriate to situation  Mood Pleasant  Thought Process  Coherency WDL  Content WDL  Delusions None reported or observed  Perception WDL  Hallucination None reported or observed  Judgment Limited  Confusion None  Danger to Self  Current suicidal ideation? Denies  Danger to Others  Danger to Others None reported or observed

## 2023-02-10 NOTE — BH IP Treatment Plan (Signed)
Interdisciplinary Treatment and Diagnostic Plan Update  02/10/2023 Time of Session: 10:55am Carolyn York MRN: [id number]  Principal Diagnosis: Bipolar affective disorder, currently manic, severe, with psychotic features (HCC)  Secondary Diagnoses: Principal Problem:   Bipolar affective disorder, currently manic, severe, with psychotic features (HCC) Active Problems:   GAD (generalized anxiety disorder)   Insomnia   Alcohol use disorder   Tetrahydrocannabinol (THC) use disorder, mild, abuse   Current Medications:  Current Facility-Administered Medications  Medication Dose Route Frequency Provider Last Rate Last Admin   diphenhydrAMINE (BENADRYL) capsule 25 mg  25 mg Oral TID PRN Starleen Blue, NP       Or   diphenhydrAMINE (BENADRYL) injection 25 mg  25 mg Intramuscular TID PRN Starleen Blue, NP       divalproex (DEPAKOTE ER) 24 hr tablet 250 mg  250 mg Oral Daily Starleen Blue, NP   250 mg at 02/09/23 2053   Followed by   Melene Muller ON 02/11/2023] divalproex (DEPAKOTE ER) 24 hr tablet 500 mg  500 mg Oral Daily Nkwenti, Doris, NP       haloperidol (HALDOL) tablet 5 mg  5 mg Oral TID PRN Liborio Nixon L, NP   5 mg at 02/07/23 1953   Or   haloperidol lactate (HALDOL) injection 5 mg  5 mg Intramuscular TID PRN Liborio Nixon L, NP       hydrOXYzine (ATARAX) tablet 25 mg  25 mg Oral Q8H PRN White, Patrice L, NP   25 mg at 02/09/23 2053   ibuprofen (ADVIL) tablet 400 mg  400 mg Oral Q6H PRN Starleen Blue, NP   400 mg at 02/09/23 1212   loperamide (IMODIUM) capsule 2-4 mg  2-4 mg Oral PRN Starleen Blue, NP       LORazepam (ATIVAN) tablet 1 mg  1 mg Oral TID PRN Starleen Blue, NP       Or   LORazepam (ATIVAN) injection 1 mg  1 mg Intramuscular TID PRN Starleen Blue, NP       LORazepam (ATIVAN) tablet 1 mg  1 mg Oral BID Starleen Blue, NP       Followed by   Melene Muller ON 02/12/2023] LORazepam (ATIVAN) tablet 1 mg  1 mg Oral Daily Nkwenti, Doris, NP       multivitamin with minerals tablet  1 tablet  1 tablet Oral Daily Nkwenti, Doris, NP   1 tablet at 02/10/23 4098   nicotine polacrilex (NICORETTE) gum 2 mg  2 mg Oral PRN Massengill, Harrold Donath, MD   2 mg at 02/10/23 1145   ondansetron (ZOFRAN-ODT) disintegrating tablet 4 mg  4 mg Oral Q6H PRN Starleen Blue, NP       paliperidone (INVEGA) 24 hr tablet 3 mg  3 mg Oral Daily Nkwenti, Doris, NP   3 mg at 02/10/23 1191   thiamine (Vitamin B-1) tablet 100 mg  100 mg Oral Daily Starleen Blue, NP   100 mg at 02/10/23 4782   Vitamin D (Ergocalciferol) (DRISDOL) 1.25 MG (50000 UNIT) capsule 50,000 Units  50,000 Units Oral Q7 days Starleen Blue, NP   50,000 Units at 02/09/23 1453   PTA Medications: Medications Prior to Admission  Medication Sig Dispense Refill Last Dose   hydrOXYzine (ATARAX) 25 MG tablet Take by mouth See admin instructions. Take 1/2 -2 tablets by mouth every 8 hours as needed for anxiety      QUEtiapine (SEROQUEL XR) 50 MG TB24 24 hr tablet Take 50 mg by mouth at bedtime.  Patient Stressors: Educational concerns   Medication change or noncompliance   Substance abuse    Patient Strengths: Average or above average intelligence  Capable of independent living  Arboriculturist fund of knowledge  Motivation for treatment/growth  Physical Health  Religious Affiliation  Supportive family/friends   Treatment Modalities: Medication Management, Group therapy, Case management,  1 to 1 session with clinician, Psychoeducation, Recreational therapy.   Physician Treatment Plan for Primary Diagnosis: Bipolar affective disorder, currently manic, severe, with psychotic features (HCC) Long Term Goal(s): Improvement in symptoms so as ready for discharge   Short Term Goals: Ability to identify changes in lifestyle to reduce recurrence of condition will improve Ability to verbalize feelings will improve Ability to disclose and discuss suicidal ideas Ability to identify and develop effective  coping behaviors will improve Ability to maintain clinical measurements within normal limits will improve Compliance with prescribed medications will improve Ability to identify triggers associated with substance abuse/mental health issues will improve  Medication Management: Evaluate patient's response, side effects, and tolerance of medication regimen.  Therapeutic Interventions: 1 to 1 sessions, Unit Group sessions and Medication administration.  Evaluation of Outcomes: Progressing  Physician Treatment Plan for Secondary Diagnosis: Principal Problem:   Bipolar affective disorder, currently manic, severe, with psychotic features (HCC) Active Problems:   GAD (generalized anxiety disorder)   Insomnia   Alcohol use disorder   Tetrahydrocannabinol (THC) use disorder, mild, abuse  Long Term Goal(s): Improvement in symptoms so as ready for discharge   Short Term Goals: Ability to identify changes in lifestyle to reduce recurrence of condition will improve Ability to verbalize feelings will improve Ability to disclose and discuss suicidal ideas Ability to identify and develop effective coping behaviors will improve Ability to maintain clinical measurements within normal limits will improve Compliance with prescribed medications will improve Ability to identify triggers associated with substance abuse/mental health issues will improve     Medication Management: Evaluate patient's response, side effects, and tolerance of medication regimen.  Therapeutic Interventions: 1 to 1 sessions, Unit Group sessions and Medication administration.  Evaluation of Outcomes: Progressing   RN Treatment Plan for Primary Diagnosis: Bipolar affective disorder, currently manic, severe, with psychotic features (HCC) Long Term Goal(s): Knowledge of disease and therapeutic regimen to maintain health will improve  Short Term Goals: Ability to remain free from injury will improve, Ability to verbalize  frustration and anger appropriately will improve, Ability to demonstrate self-control, Ability to participate in decision making will improve, Ability to verbalize feelings will improve, Ability to disclose and discuss suicidal ideas, Ability to identify and develop effective coping behaviors will improve, and Compliance with prescribed medications will improve  Medication Management: RN will administer medications as ordered by provider, will assess and evaluate patient's response and provide education to patient for prescribed medication. RN will report any adverse and/or side effects to prescribing provider.  Therapeutic Interventions: 1 on 1 counseling sessions, Psychoeducation, Medication administration, Evaluate responses to treatment, Monitor vital signs and CBGs as ordered, Perform/monitor CIWA, COWS, AIMS and Fall Risk screenings as ordered, Perform wound care treatments as ordered.  Evaluation of Outcomes: Progressing   LCSW Treatment Plan for Primary Diagnosis: Bipolar affective disorder, currently manic, severe, with psychotic features (HCC) Long Term Goal(s): Safe transition to appropriate next level of care at discharge, Engage patient in therapeutic group addressing interpersonal concerns.  Short Term Goals: Engage patient in aftercare planning with referrals and resources, Increase social support, Increase ability to appropriately verbalize feelings, Increase  emotional regulation, Facilitate acceptance of mental health diagnosis and concerns, Facilitate patient progression through stages of change regarding substance use diagnoses and concerns, Identify triggers associated with mental health/substance abuse issues, and Increase skills for wellness and recovery  Therapeutic Interventions: Assess for all discharge needs, 1 to 1 time with Social worker, Explore available resources and support systems, Assess for adequacy in community support network, Educate family and significant other(s)  on suicide prevention, Complete Psychosocial Assessment, Interpersonal group therapy.  Evaluation of Outcomes: Progressing   Progress in Treatment: Attending groups: Yes. Participating in groups: Yes. Taking medication as prescribed: Yes. Toleration medication: Yes. Family/Significant other contact made: Yes, individual(s) contacted:  Laurabelle Gorczyca ( mom ) [phone] Patient understands diagnosis: Yes. Discussing patient identified problems/goals with staff: Yes. Medical problems stabilized or resolved: Yes. Denies suicidal/homicidal ideation: Yes. Issues/concerns per patient self-inventory: Yes.  New problem(s) identified: No, Describe:  none reported  New Short Term/Long Term Goal(s):   medication stabilization, elimination of SI thoughts, development of comprehensive mental wellness plan.    Patient Goals: Pt states, "I want to get closer to god and control anxiety and depression"   Discharge Plan or Barriers:  Patient recently admitted. CSW will continue to follow and assess for appropriate referrals and possible discharge planning.    Reason for Continuation of Hospitalization: Anxiety Depression Hallucinations Medication stabilization  Estimated Length of Stay: 1-3  days   Last 3 [place] Suicide Severity Risk Score: Flowsheet Row Ad[facility] (Current) [facility] from [facility] in [facility] ED from 11/13/2021 in [facility] Department at [facility]  C-SSRS RISK CATEGORY No Risk High Risk No Risk       Last PHQ 2/9 Scores:     No data to display          Scribe for Treatment Team: Beatris Si, LCSW 02/10/2023 1:57 PM
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
## 2023-02-10 NOTE — BHH Group Notes (Signed)
Patient attended the AA group. 

## 2023-02-10 NOTE — Group Note (Unsigned)
Date:  02/10/2023 Time:  4:29 PM  Group Topic/Focus:  Developing a Wellness Toolbox:   The focus of this group is to help patients develop a "wellness toolbox" with skills and strategies to promote recovery upon discharge.     Participation Level:  {BHH PARTICIPATION LEVEL:22264}  Participation Quality:  {BHH PARTICIPATION QUALITY:22265}  Affect:  {BHH AFFECT:22266}  Cognitive:  {BHH COGNITIVE:22267}  Insight: {BHH Insight2:20797}  Engagement in Group:  {BHH ENGAGEMENT IN GROUP:22268}  Modes of Intervention:  {BHH MODES OF INTERVENTION:22269}  Additional Comments:  ***  Ishmel Acevedo G Keanu Frickey 02/10/2023, 4:29 PM  

## 2023-02-10 NOTE — Progress Notes (Signed)
Spiritual Resources group surrounding sources of strength facilitated by Chaplain Gayla Medicus  Group Goal: Support / Education around Scientist, product/process development  Members engage in facilitated group support and psycho-social education.  Group Description:  Following introductions and group rules, group members engaged in facilitated group dialogue and support around the topic of strength, with particular support around experiences of not having strength and/or having strength in their lives. Group Identified types of strength that motivate them and identified patterns, circumstances, and changes that included identifying their strength characteristics. Chaplain facilitated group discussion and supported group members contributions and support of one another. Group members were encouraged to individually reflect on their strengths and how they could use support systems to assist in having more strength.   Patient Progress: Carolyn York attended group and actively engaged and participated in the group conversation and activities.  She shared about a support group resource others were interested in. She supported her peers and her comments demonstrated good insight into the topic.   Participation Level:  Active Participation Quality:  Appropriate Insight: Appropriate Engagement in Group:  Engaged Modes of Intervention:  Discussion and Worksheets  Animator

## 2023-02-10 NOTE — Progress Notes (Signed)
   02/10/23 2200  Psych Admission Type (Psych Patients Only)  Admission Status Involuntary  Psychosocial Assessment  Patient Complaints None  Eye Contact Fair  Facial Expression Worried  Affect Appropriate to circumstance  Speech Logical/coherent  Interaction Assertive  Motor Activity Slow  Appearance/Hygiene Unremarkable  Behavior Characteristics Cooperative;Appropriate to situation  Mood Pleasant  Thought Process  Coherency Circumstantial  Content Paranoia;Preoccupation  Delusions Paranoid  Perception Hallucinations  Hallucination Auditory  Judgment Poor  Confusion None  Danger to Self  Current suicidal ideation? Denies  Danger to Others  Danger to Others None reported or observed

## 2023-02-10 NOTE — Progress Notes (Signed)
[facility] MD Progress Note  02/10/2023 4:00 PM Carolyn York  MRN:  [id number] Principal Problem: Bipolar affective disorder, currently manic, severe, with psychotic features (HCC) Diagnosis: Principal Problem:   Bipolar affective disorder, currently manic, severe, with psychotic features (HCC) Active Problems:   GAD (generalized anxiety disorder)   Insomnia   Alcohol use disorder   Tetrahydrocannabinol (THC) use disorder, mild, abuse  Reason For Admission: Carolyn York is a 22 yo female with a prior mental health diagnosis of bipolar who presented to the [facility] ER with +SI with plan to overdose & psychosis; reported VH of her boyfriend killing himself & +AH of voices stating that she is next to die. The above was in the context of medication non adherence and ETOH & THC abuse. Pt was involuntarily committed prior to being transferred to this [facility] for treatment and stabilization of her mental status.   24 hour chart review: Sleep Hours last night: None documented, reports a good sleep quality Nursing Concerns: None reported  Behavioral episodes in the past 24 hrs: Medication Compliance: Compliant  Vital Signs in the past 24 hrs: Assigned RN notified to recheck vitals as  PRN Medications in the past 24 hrs: Ibuprofen, Nicorette gum, Hydroxyzine.  Patient assessment note: As per both objective and subjective assessments, pt's mood has significantly improved as compared to day of admission and days following admission. Pt presents today with a euthymic mood, and affect is congruent. Her attention to personal hygiene and grooming is good, eye contact is good, speech is clear & coherent. Thought contents are organized and logical, and pt currently denies SI/HI/AVH or paranoia. There is no evidence of delusional thoughts.    Ativan taper is set to be completed on 5/1 @ 0800, but pt is requesting to be discharged tomorrow as she needs to turn in some work for school in order to be able to  graduate on time, as graduation is set for 5/12. She is interested in the Invega LAI, and one dose of 156 mg will be given to her today as a loading dose since she is smaller in weight at 40.6 kg. As per pharmacist, this dose can be given as loading, and 117 mg given in 4 days. Pt will need to take second dose outpatient. She is reporting a good sleep quality, reports a good appetite, denies medication related side effects. Vitamin D low at 13.29, supplementing with 50.000 units weekly. We are starting Inderal 10 mg BID for tachycardia/anxiety. No TD/EPS type symptoms found on assessment, and pt denies any feelings of stiffness. AIMS: 0. Medications for today will be as below.   Total Time spent with patient: 45 minutes  Past Psychiatric History: See H & P  Past Medical History:  Past Medical History:  Diagnosis Date   Bipolar disorder [facility])     Past Surgical History:  Procedure Laterality Date   WISDOM TOOTH EXTRACTION     Family History: History reviewed. No pertinent family history. Family Psychiatric  History: See H & P Social History:  Social History   Substance and Sexual Activity  Alcohol Use Yes   Comment: 1/5 liquor a week     Social History   Substance and Sexual Activity  Drug Use Not Currently   Types: Marijuana    Social History   Socioeconomic History   Marital status: Single    Spouse name: Not on file   Number of children: Not on file   Years of education: Not on file  Highest education level: Not on file  Occupational History   Not on file  Tobacco Use   Smoking status: Never   Smokeless tobacco: Never  Vaping Use   Vaping Use: Every day  Substance and Sexual Activity   Alcohol use: Yes    Comment: 1/5 liquor a week   Drug use: Not Currently    Types: Marijuana   Sexual activity: Not on file  Other Topics Concern   Not on file  Social History Narrative   Not on file   Social Determinants of Health   Financial Resource Strain: Not on file   Food Insecurity: Food Insecurity Present (02/07/2023)   Hunger Vital Sign    Worried About Running Out of Food in the Last Year: Sometimes true    Ran Out of Food in the Last Year: Sometimes true  Transportation Needs: No Transportation Needs (02/07/2023)   PRAPARE - Administrator, Civil Service (Medical): No    Lack of Transportation (Non-Medical): No  Physical Activity: Not on file  Stress: Not on file  Social Connections: Not on file   Sleep: Good  Appetite:  Good  Current Medications: Current Facility-Administered Medications  Medication Dose Route Frequency Provider Last Rate Last Admin   diphenhydrAMINE (BENADRYL) capsule 25 mg  25 mg Oral TID PRN Starleen Blue, NP       Or   diphenhydrAMINE (BENADRYL) injection 25 mg  25 mg Intramuscular TID PRN Starleen Blue, NP       divalproex (DEPAKOTE ER) 24 hr tablet 250 mg  250 mg Oral Daily Starleen Blue, NP   250 mg at 02/09/23 2053   Followed by   Melene Muller ON 02/11/2023] divalproex (DEPAKOTE ER) 24 hr tablet 500 mg  500 mg Oral Daily Zaniel Marineau, NP       haloperidol (HALDOL) tablet 5 mg  5 mg Oral TID PRN Liborio Nixon L, NP   5 mg at 02/07/23 1953   Or   haloperidol lactate (HALDOL) injection 5 mg  5 mg Intramuscular TID PRN White, Patrice L, NP       hydrOXYzine (ATARAX) tablet 25 mg  25 mg Oral Q8H PRN White, Patrice L, NP   25 mg at 02/09/23 2053   ibuprofen (ADVIL) tablet 400 mg  400 mg Oral Q6H PRN Starleen Blue, NP   400 mg at 02/09/23 1212   loperamide (IMODIUM) capsule 2-4 mg  2-4 mg Oral PRN Starleen Blue, NP       LORazepam (ATIVAN) tablet 1 mg  1 mg Oral TID PRN Starleen Blue, NP       Or   LORazepam (ATIVAN) injection 1 mg  1 mg Intramuscular TID PRN Starleen Blue, NP       LORazepam (ATIVAN) tablet 1 mg  1 mg Oral BID Jeramey Lanuza, NP       Followed by   Melene Muller ON 02/12/2023] LORazepam (ATIVAN) tablet 1 mg  1 mg Oral Daily Riti Rollyson, NP       multivitamin with minerals tablet 1 tablet  1  tablet Oral Daily Starleen Blue, NP   1 tablet at 02/10/23 4098   nicotine polacrilex (NICORETTE) gum 2 mg  2 mg Oral PRN Massengill, Harrold Donath, MD   2 mg at 02/10/23 1511   ondansetron (ZOFRAN-ODT) disintegrating tablet 4 mg  4 mg Oral Q6H PRN Starleen Blue, NP       paliperidone (INVEGA SUSTENNA) injection 156 mg  156 mg Intramuscular Once Starleen Blue, NP  paliperidone (INVEGA) 24 hr tablet 3 mg  3 mg Oral Daily Starleen Blue, NP   3 mg at 02/10/23 4132   propranolol (INDERAL) tablet 10 mg  10 mg Oral BID Starleen Blue, NP       thiamine (Vitamin B-1) tablet 100 mg  100 mg Oral Daily Starleen Blue, NP   100 mg at 02/10/23 4401   Vitamin D (Ergocalciferol) (DRISDOL) 1.25 MG (50000 UNIT) capsule 50,000 Units  50,000 Units Oral Q7 days Starleen Blue, NP   50,000 Units at 02/09/23 1453    Lab Results:  Results for orders placed or performed during the hospital encounter of 02/07/23 (from the past 48 hour(s))  Urinalysis, Routine w reflex microscopic -Urine, Clean Catch     Status: Abnormal   Collection Time: 02/08/23  4:38 PM  Result Value Ref Range   Color, Urine YELLOW YELLOW   APPearance TURBID (A) CLEAR   Specific Gravity, Urine 1.030 1.005 - 1.030   pH 5.0 5.0 - 8.0   Glucose, UA NEGATIVE NEGATIVE mg/dL   Hgb urine dipstick NEGATIVE NEGATIVE   Bilirubin Urine NEGATIVE NEGATIVE   Ketones, ur NEGATIVE NEGATIVE mg/dL   Protein, ur 30 (A) NEGATIVE mg/dL   Nitrite NEGATIVE NEGATIVE   Leukocytes,Ua NEGATIVE NEGATIVE   RBC / HPF 0-5 0 - 5 RBC/hpf   WBC, UA 0-5 0 - 5 WBC/hpf   Bacteria, UA NONE SEEN NONE SEEN   Squamous Epithelial / HPF 0-5 0 - 5 /HPF   Mucus PRESENT    Amorphous Crystal PRESENT     Comment: Performed at [facility], [address], [place], [place] [address]  TSH     Status: None   Collection Time: 02/09/23  6:24 AM  Result Value Ref Range   TSH 1.787 0.350 - 4.500 uIU/mL    Comment: Performed by a 3rd Generation assay with a functional  sensitivity of <=0.01 uIU/mL. Performed at [facility], [address] [place], [place] [address]   Hemoglobin A1c     Status: None   Collection Time: 02/09/23  6:24 AM  Result Value Ref Range   Hgb A1c MFr Bld 4.9 4.8 - 5.6 %    Comment: (NOTE) Pre diabetes:          5.7%-6.4%  Diabetes:              >6.4%  Glycemic control for   <7.0% adults with diabetes    Mean Plasma Glucose 93.93 mg/dL    Comment: Performed at [facility], [address] [place], [place] [address]  Lipid panel     Status: None   Collection Time: 02/09/23  6:24 AM  Result Value Ref Range   Cholesterol 162 0 - 200 mg/dL   Triglycerides 69 <259 mg/dL   HDL 56 >56 mg/dL   Total CHOL/HDL Ratio 2.9 RATIO   VLDL 14 0 - 40 mg/dL   LDL Cholesterol 92 0 - 99 mg/dL    Comment:        Total Cholesterol/HDL:CHD Risk Coronary Heart Disease Risk Table                     Men   Women  1/2 Average Risk   3.4   3.3  Average Risk       5.0   4.4  2 X Average Risk   9.6   7.1  3 X Average Risk  23.4   11.0  Use the calculated Patient Ratio above and the CHD Risk Table to determine the patient's CHD Risk.        ATP III CLASSIFICATION (LDL):  <100     mg/dL   Optimal  161-096  mg/dL   Near or Above                    Optimal  130-159  mg/dL   Borderline  045-409  mg/dL   High  >811     mg/dL   Very High Performed at [facility], [address], [place], [place] [address]   VITAMIN D 25 Hydroxy (Vit-D Deficiency, Fractures)     Status: Abnormal   Collection Time: 02/09/23  6:24 AM  Result Value Ref Range   Vit D, 25-Hydroxy 13.29 (L) 30 - 100 ng/mL    Comment: (NOTE) Vitamin D deficiency has been defined by the Institute of Medicine  and an Endocrine Society practice guideline as a level of serum 25-OH  vitamin D less than 20 ng/mL (1,2). The Endocrine Society went on to  further define vitamin D insufficiency as a level between 21 and 29  ng/mL  (2).  1. IOM (Institute of Medicine). 2010. Dietary reference intakes for  calcium and D. [place]: [employer]. 2. Holick MF, Binkley Keyes, Bischoff-Ferrari HA, et al. Evaluation,  treatment, and prevention of vitamin D deficiency: an Endocrine  Society clinical practice guideline, JCEM. 2011 Jul; 96(7): 1911-30.  Performed at [facility], [address], [place], [place] [address]    Blood Alcohol level:  Lab Results  Component Value Date   ETH <10 02/06/2023   ETH 372 (HH) 09/17/2021   Metabolic Disorder Labs: Lab Results  Component Value Date   HGBA1C 4.9 02/09/2023   MPG 93.93 02/09/2023   No results found for: "PROLACTIN" Lab Results  Component Value Date   CHOL 162 02/09/2023   TRIG 69 02/09/2023   HDL 56 02/09/2023   CHOLHDL 2.9 02/09/2023   VLDL 14 02/09/2023   LDLCALC 92 02/09/2023   Physical Findings: AIMS: 0 CIWA:  CIWA-Ar Total: 2 COWS:  n/a  Musculoskeletal: Strength & Muscle Tone: within normal limits Gait & Station: normal Patient leans: N/A  Psychiatric Specialty Exam:  Presentation  General Appearance:  Appropriate for Environment; Fairly Groomed  Eye Contact: Good  Speech: Clear and Coherent  Speech Volume: Normal  Handedness: Right  Mood and Affect  Mood: Euthymic  Affect: Congruent  Thought Process  Thought Processes: Coherent  Descriptions of Associations:Intact  Orientation:Full (Time, Place and Person)  Thought Content:Logical  History of Schizophrenia/Schizoaffective disorder:No  Duration of Psychotic Symptoms:Greater than six months  Hallucinations:Hallucinations: None  Ideas of Reference:None  Suicidal Thoughts:Suicidal Thoughts: No  Homicidal Thoughts:Homicidal Thoughts: No  Sensorium  Memory: Immediate Good  Judgment: Good  Insight: Good  Executive Functions  Concentration: Good  Attention Span: Good  Recall: Good  Fund of  Knowledge: Good  Language: Good  Psychomotor Activity  Psychomotor Activity: Psychomotor Activity: Normal  Assets  Assets: Communication Skills  Sleep  Sleep: Sleep: Good  Physical Exam: Physical Exam Constitutional:      Appearance: Normal appearance.  HENT:     Head: Normocephalic.     Nose: No congestion or rhinorrhea.  Eyes:     Pupils: Pupils are equal, round, and reactive to light.  Musculoskeletal:        General: Normal range of motion.     Cervical back: Normal  range of motion.  Neurological:     Mental Status: She is alert and oriented to person, place, and time.  Psychiatric:        Thought Content: Thought content normal.    Review of Systems  Constitutional:  Negative for fever.  HENT:  Negative for hearing loss.   Eyes:  Negative for blurred vision.  Respiratory:  Negative for cough.   Cardiovascular:  Negative for chest pain.  Gastrointestinal:  Negative for heartburn.  Genitourinary:  Negative for dysuria.  Musculoskeletal:  Negative for myalgias.  Skin:  Negative for rash.  Neurological:  Negative for dizziness.  Psychiatric/Behavioral:  Positive for depression, hallucinations and substance abuse. Negative for memory loss and suicidal ideas. The patient is nervous/anxious and has insomnia.    Blood pressure (!) 134/103, pulse (!) 106, temperature 98.1 F (36.7 C), temperature source Oral, resp. rate 16, height 5' 0.5" (1.537 m), weight 40.6 kg, last menstrual period 02/07/2023, SpO2 99 %. Body mass index is 17.21 kg/m.  Treatment Plan Summary: Daily contact with patient to assess and evaluate symptoms and progress in treatment and Medication management  Treatment Plan Summary: Daily contact with patient to assess and evaluate symptoms and progress in treatment and Medication management   Observation Level/Precautions:  15 minute checks  Laboratory:  Labs reviewed: Vitamin D low at 13.29, supplementing with 50.000 units weekly.  EKG with QTC  WNL.   Psychotherapy:  Unit Group sessions  Medications:  See [facility]  Consultations:  To be determined   Discharge Concerns:  Safety, medication compliance, mood stability  Estimated LOS: 5-7 days  Other:  N/A    PLAN Safety and Monitoring: Voluntary admission to inpatient psychiatric unit for safety, stabilization and treatment Daily contact with patient to assess and evaluate symptoms and progress in treatment Patient's case to be discussed in multi-disciplinary team meeting Observation Level : q15 minute checks Vital signs: q12 hours Precautions: Safety   Long Term Goal(s): Improvement in symptoms so as ready for discharge   Short Term Goals: Ability to identify changes in lifestyle to reduce recurrence of condition will improve, Ability to verbalize feelings will improve, Ability to disclose and discuss suicidal ideas, Ability to identify and develop effective coping behaviors will improve, Ability to maintain clinical measurements within normal limits will improve, Compliance with prescribed medications will improve, and Ability to identify triggers associated with substance abuse/mental health issues will improve   Diagnoses  Principal Problem:   Bipolar affective disorder, currently manic, severe, with psychotic features (HCC) Active Problems:   GAD (generalized anxiety disorder)   Insomnia   Alcohol use disorder   Tetrahydrocannabinol (THC) use disorder, mild, abuse   Medications -Give Invega Sustenna 156 mg x 1 dose on 4/29 -Start Inderal 10 mg BID for tachycardia/anxiety -Continue 3 mg daily for psychosis/mood stabilization -Continue Vitamin D 50.000 units weekly for low Vit D of 13.29 -Continue Ativan taper for alcohol use d/o-See MAR for complete order -Continue Hydroxyzine 25 mg TID PRN for anxiety -Continue Depakote to 250 mg nightly  -Continue Agitation Protocol Medications as follows: Benadryl 25 mg PRN, Ativan 1 mg PRN & Continue Haldol 5 mg PRN-See MAR for  complete order   Other PRNS -Continue Tylenol 650 mg every 6 hours PRN for mild pain -Continue Maalox 30 mg every 4 hrs PRN for indigestion -Continue Imodium 2-4 mg as needed for diarrhea -Continue Milk of Magnesia as needed every 6 hrs for constipation -Continue Zofran disintegrating tabs every 6 hrs PRN for nausea  Discharge Planning: Social work and case management to assist with discharge planning and identification of hospital follow-up needs prior to discharge Estimated LOS: 5-7 days Discharge Concerns: Need to establish a safety plan; Medication compliance and effectiveness Discharge Goals: Return home with outpatient referrals for mental health follow-up including medication management/psychotherapy   I certify that inpatient services furnished can reasonably be expected to improve the patient's condition.     Starleen Blue, NP 02/10/2023, 4:00 PMPatient ID: Carolyn York, female   DOB: 03-07-2001, 22 y.o.   MRN: [id number]

## 2023-02-10 NOTE — [facility] Suicide Risk Assessment (Signed)
[facility] INPATIENT:  Family/Significant Other Suicide Prevention Education  Suicide Prevention Education:  Education Completed; Dymon Summerhill ( mom ) [phone],  (name of family member/significant other) has been identified by the patient as the family member/significant other with whom the patient will be residing, and identified as the person(s) who will aid the patient in the event of a mental health crisis (suicidal ideations/suicide attempt).  With written consent from the patient, the family member/significant other has been provided the following suicide prevention education, prior to the and/or following the discharge of the patient.   The suicide prevention education provided includes the following: Suicide risk factors Suicide prevention and interventions National Suicide Hotline telephone number [facility] assessment telephone number [facility] Emergency Assistance 911 [facility] and/or Residential Mobile Crisis Unit telephone number  Request made of family/significant other to: Remove weapons (e.g., guns, rifles, knives), all items previously/currently identified as safety concern.   Remove drugs/medications (over-the-counter, prescriptions, illicit drugs), all items previously/currently identified as a safety concern.  The family member/significant other verbalizes understanding of the suicide prevention education information provided.  The family member/significant other agrees to remove the items of safety concern listed above.  Isabella Bowens 02/10/2023, 3:32 PM

## 2023-02-10 NOTE — [facility] Group Notes (Signed)
Adult Psychoeducational Group Note  Date:  02/10/2023 Time:  11:28 AM  Group Topic/Focus:  Goals Group:   The focus of this group is to help patients establish daily goals to achieve during treatment and discuss how the patient can incorporate goal setting into their daily lives to aide in recovery. Orientation:   The focus of this group is to educate the patient on the purpose and policies of crisis stabilization and provide a format to answer questions about their admission.  The group details unit policies and expectations of patients while admitted.  Participation Level:  Active  Participation Quality:  Appropriate  Affect:  Appropriate  Cognitive:  Appropriate  Insight: Appropriate  Engagement in Group:  Engaged  Modes of Intervention:  Discussion  Additional Comments:  Pt attended the goals/orientation group and remained appropriate and engaged throughout the duration of the group. \  Fara Olden O 02/10/2023, 11:28 AM

## 2023-02-11 DIAGNOSIS — F312 Bipolar disorder, current episode manic severe with psychotic features: Secondary | ICD-10-CM

## 2023-02-11 MED ORDER — LORAZEPAM 1 MG PO TABS: 1.0000 mg | ORAL_TABLET | Freq: Every day | ORAL | 0 refills | Status: DC

## 2023-02-11 MED ORDER — NICOTINE POLACRILEX 2 MG MT GUM: 2.0000 mg | CHEWING_GUM | OROMUCOSAL | 0 refills | Status: DC | PRN

## 2023-02-11 MED ORDER — PALIPERIDONE ER 3 MG PO TB24: 3.0000 mg | ORAL_TABLET | Freq: Every day | ORAL | 0 refills | Status: DC

## 2023-02-11 MED ORDER — HYDROXYZINE HCL 25 MG PO TABS: 25.0000 mg | ORAL_TABLET | Freq: Three times a day (TID) | ORAL | 0 refills | Status: DC | PRN

## 2023-02-11 MED ORDER — PROPRANOLOL HCL 10 MG PO TABS: 10.0000 mg | ORAL_TABLET | Freq: Two times a day (BID) | ORAL | 0 refills | Status: DC

## 2023-02-11 MED ORDER — VITAMIN B-1 100 MG PO TABS: 100.0000 mg | ORAL_TABLET | Freq: Every day | ORAL | 0 refills | Status: AC

## 2023-02-11 MED ORDER — VITAMIN D (ERGOCALCIFEROL) 1.25 MG (50000 UNIT) PO CAPS: 50000.0000 [IU] | ORAL_CAPSULE | ORAL | 0 refills | Status: AC

## 2023-02-11 MED ORDER — LORAZEPAM 1 MG PO TABS: 1.0000 mg | ORAL_TABLET | Freq: Every day | ORAL | 0 refills | Status: AC

## 2023-02-11 MED ORDER — DIVALPROEX SODIUM ER 500 MG PO TB24: 500.0000 mg | ORAL_TABLET | Freq: Every day | ORAL | 0 refills | Status: DC

## 2023-02-11 MED ORDER — PALIPERIDONE PALMITATE ER 117 MG/0.75ML IM SUSY: 117.0000 mg | PREFILLED_SYRINGE | Freq: Once | INTRAMUSCULAR | Status: DC

## 2023-02-11 NOTE — [facility] Group Notes (Signed)
Adult Psychoeducational Group Note  Date:  02/11/2023 Time:  10:46 AM  Group Topic/Focus:  Goals Group:   The focus of this group is to help patients establish daily goals to achieve during treatment and discuss how the patient can incorporate goal setting into their daily lives to aide in recovery.  Participation Level:  Active  Participation Quality:  Appropriate  Affect:  Appropriate  Cognitive:  Appropriate  Insight: Appropriate  Engagement in Group:  Engaged  Modes of Intervention:  Education  Additional Comments:  Goal: Understand [name]  Carolyn York 02/11/2023, 10:46 AM

## 2023-02-11 NOTE — Discharge Summary (Signed)
Physician Discharge Summary Note  Patient:  Carolyn York is an 22 y.o., female MRN:  [id number] DOB:  10-08-2001 Patient phone:  [phone] (home)  Patient address:   [address] [place] [address],  Total Time spent with patient: 45 minutes  Date of Admission:  02/07/2023 Date of Discharge: 02/11/2023  Reason for Admission: Carolyn York is a 22 yo female with a prior mental health diagnosis of bipolar who presented to the [facility] ER with +SI with plan to overdose & psychosis; reported VH of her boyfriend killing himself & +AH of voices stating that she is next to die. The above was in the context of medication non adherence and ETOH & THC abuse. Pt was involuntarily committed prior to being transferred to this [facility] for treatment and stabilization of her mental status.   Principal Problem: Bipolar affective disorder, currently manic, severe, with psychotic features [facility]) Discharge Diagnoses: Principal Problem:   Bipolar affective disorder, currently manic, severe, with psychotic features (HCC) Active Problems:   GAD (generalized anxiety disorder)   Insomnia   Alcohol use disorder   Tetrahydrocannabinol (THC) use disorder, mild, abuse  Past Psychiatric History: See H & P  Past Medical History:  Past Medical History:  Diagnosis Date   Bipolar disorder (HCC)     Past Surgical History:  Procedure Laterality Date   WISDOM TOOTH EXTRACTION     Family History: History reviewed. No pertinent family history. Family Psychiatric  History: See H & P Social History:  Social History   Substance and Sexual Activity  Alcohol Use Yes   Comment: 1/5 liquor a week     Social History   Substance and Sexual Activity  Drug Use Not Currently   Types: Marijuana    Social History   Socioeconomic History   Marital status: Single    Spouse name: Not on file   Number of children: Not on file   Years of education: Not on file   Highest education level: Not on file   Occupational History   Not on file  Tobacco Use   Smoking status: Never   Smokeless tobacco: Never  Vaping Use   Vaping Use: Every day  Substance and Sexual Activity   Alcohol use: Yes    Comment: 1/5 liquor a week   Drug use: Not Currently    Types: Marijuana   Sexual activity: Not on file  Other Topics Concern   Not on file  Social History Narrative   Not on file   Social Determinants of Health   Financial Resource Strain: Not on file  Food Insecurity: Food Insecurity Present (02/07/2023)   Hunger Vital Sign    Worried About Running Out of Food in the Last Year: Sometimes true    Ran Out of Food in the Last Year: Sometimes true  Transportation Needs: No Transportation Needs (02/07/2023)   PRAPARE - Administrator, Civil Service (Medical): No    Lack of Transportation (Non-Medical): No  Physical Activity: Not on file  Stress: Not on file  Social Connections: Not on file   Hospital Course:   During the patient's hospitalization, patient had extensive initial psychiatric evaluation, and follow-up psychiatric evaluations every day.  Psychiatric diagnoses provided upon initial assessment are as listed above.  Patient's psychiatric medications were adjusted on admission as follows:  -Start Invega 1.5 mg daily for psychosis/mood stabilization -Start Ativan taper for alcohol use d/o-See MAR for complete order -Start Hydroxyzine 25 mg TID PRN for anxiety -Start  Depakote 250 mg x 3 doses, and increase to 500 mg nightly for mood stabilization -Reduce Agitation Protocol Medications as follows: Benadryl from 50 mg to 25 mg PRN, Ativan from 2 mg to 1 mg PRN & Continue Haldol 5 mg PRN   During the hospitalization, other adjustments were made to the patient's psychiatric medication regimen. Medications at discharge are as follows:   -Pt was agreeable to and given Invega Sustenna 156 mg x 1 dose on 4/29. Next dose of 117 mg will be given on 02/18/2023 @ 10.00 Am at the [facility] center. Pt to pick up the injection from her pharmacy and take it with her to the appt. -Continue Invega 3 mg daily x 14 days, then stop and continue with monthly LAI injection (117mg  monthly). -Continue Inderal 10 mg BID for tachycardia/anxiety -Continue Vitamin D 50.000 units weekly for low Vit D of 13.29 -Continue Ativan taper for alcohol use d/o-See MAR for complete order -Continue Hydroxyzine 25 mg TID PRN for anxiety -Continue Depakote to 250 mg nightly  (check level outpatient on 5/7)   Patient's care was discussed during the interdisciplinary team meeting every day during the hospitalization. The patient denies having side effects to prescribed psychiatric medication. Gradually, patient started adjusting to milieu. The patient was evaluated each day by a clinical provider to ascertain response to treatment. Improvement was noted by the patient's report of decreasing symptoms, improved sleep and appetite, affect, medication tolerance, behavior, and participation in unit programming.  Patient was asked each day to complete a self inventory noting mood, mental status, pain, new symptoms, anxiety and concerns.     Symptoms were reported as significantly decreased or resolved completely by discharge. On day of discharge, the patient reports that their mood is stable. The patient denied having suicidal thoughts for more than 48 hours prior to discharge.  Patient denies having homicidal thoughts.  Patient denies having auditory hallucinations.  Patient denies any visual hallucinations or other symptoms of psychosis. The patient was motivated to continue taking medication with a goal of continued improvement in mental health.    The patient reports their target psychiatric symptoms of depression, anxiety, insomnia responded well to the psychiatric medications, and the patient reports overall benefit from this psychiatric hospitalization. Supportive psychotherapy was provided to  the patient. The patient also participated in regular group therapy while hospitalized. Coping skills, problem solving as well as relaxation therapies were also part of the unit programming.   Labs were reviewed with the patient, and abnormal results were discussed with the patient.   The patient is able to verbalize their individual safety plan to this provider.   # It is recommended to the patient to continue psychiatric medications as prescribed, after discharge from the hospital.     # It is recommended to the patient to follow up with your outpatient psychiatric provider and PCP.   # It was discussed with the patient, the impact of alcohol, drugs, tobacco have been there overall psychiatric and medical wellbeing, and total abstinence from substance use was recommended the patient.ed.   # Prescriptions provided or sent directly to preferred pharmacy at discharge. Patient agreeable to plan. Given opportunity to ask questions. Appears to feel comfortable with discharge.    # In the event of worsening symptoms, the patient is instructed to call the crisis hotline (988), 911 and or go to the nearest ED for appropriate evaluation and treatment of symptoms. To follow-up with primary care provider for other medical  issues, concerns and or health care needs   # [facility] with a plan to follow up as noted below.    Physical Findings: AIMS: 0 CIWA:  CIWA-Ar Total: 1 COWS: n/a  Musculoskeletal: Strength & Muscle Tone: within normal limits Gait & Station: normal Patient leans: N/A  Psychiatric Specialty Exam:  Presentation  General Appearance:  Appropriate for Environment; Fairly Groomed  Eye Contact: Good  Speech: Clear and Coherent  Speech Volume: Normal  Handedness: Right  Mood and Affect  Mood: Euthymic  Affect: Congruent; Appropriate  Thought Process  Thought Processes: Coherent  Descriptions of  Associations:Intact  Orientation:Full (Time, Place and Person)  Thought Content:Logical  History of Schizophrenia/Schizoaffective disorder:No  Duration of Psychotic Symptoms:Greater than six months  Hallucinations:Hallucinations: None  Ideas of Reference:None  Suicidal Thoughts:Suicidal Thoughts: No  Homicidal Thoughts:Homicidal Thoughts: No  Sensorium  Memory: Immediate Good  Judgment: Good  Insight: Good  Executive Functions  Concentration: Good  Attention Span: Good  Recall: Good  Fund of Knowledge: Good  Language: Good  Psychomotor Activity  Psychomotor Activity: Psychomotor Activity: Normal  Assets  Assets: Communication Skills  Sleep  Sleep: Sleep: Good  Physical Exam: Physical Exam Constitutional:      Appearance: Normal appearance.  HENT:     Nose: No congestion or rhinorrhea.  Eyes:     Pupils: Pupils are equal, round, and reactive to light.  Pulmonary:     Effort: Pulmonary effort is normal.  Musculoskeletal:        General: Normal range of motion.  Neurological:     Mental Status: She is alert and oriented to person, place, and time.  Psychiatric:        Behavior: Behavior normal.        Thought Content: Thought content normal.    Review of Systems  Constitutional:  Negative for fever.  HENT:  Negative for hearing loss.   Eyes:  Negative for blurred vision.  Respiratory:  Negative for cough.   Cardiovascular:  Negative for chest pain.  Genitourinary:  Negative for dysuria.  Musculoskeletal:  Negative for myalgias.  Skin:  Negative for rash.  Neurological:  Negative for dizziness.  Psychiatric/Behavioral:  Positive for depression (Denies SI/HI/AVH, denies paranoia and there are no signs of delusional thinking. Verbally contracts for safety outside of [facility]) and substance abuse (Educated on the negative impact of substance abuse on her mental health.). Negative for hallucinations, memory loss and suicidal ideas. The patient  is nervous/anxious (Resolving) and has insomnia (Resolving).    Blood pressure (!) 129/95, pulse 82, temperature 98.3 F (36.8 C), temperature source Oral, resp. rate 16, height 5' 0.5" (1.537 m), weight 40.6 kg, last menstrual period 02/07/2023, SpO2 100 %. Body mass index is 17.21 kg/m.   Social History   Tobacco Use  Smoking Status Never  Smokeless Tobacco Never   Tobacco Cessation:  A prescription for an FDA-approved tobacco cessation medication provided at discharge   Blood Alcohol level:  Lab Results  Component Value Date   ETH <10 02/06/2023   ETH 372 (HH) 09/17/2021    Metabolic Disorder Labs:  Lab Results  Component Value Date   HGBA1C 4.9 02/09/2023   MPG 93.93 02/09/2023   No results found for: "PROLACTIN" Lab Results  Component Value Date   CHOL 162 02/09/2023   TRIG 69 02/09/2023   HDL 56 02/09/2023   CHOLHDL 2.9 02/09/2023   VLDL 14 02/09/2023   LDLCALC 92 02/09/2023   See  Psychiatric Specialty Exam and Suicide Risk Assessment completed by Attending Physician prior to discharge.  Discharge destination:  Home  Is patient on multiple antipsychotic therapies at discharge:  No   Has Patient had three or more failed trials of antipsychotic monotherapy by history:  No  Recommended Plan for Multiple Antipsychotic Therapies: NA   Allergies as of 02/11/2023       Reactions   Monistat [miconazole]    Other Hives   amoxicillin        Medication List     STOP taking these medications    QUEtiapine 50 MG Tb24 24 hr tablet Commonly known as: SEROQUEL XR       TAKE these medications      Indication  divalproex 500 MG 24 hr tablet Commonly known as: DEPAKOTE ER Take 1 tablet (500 mg total) by mouth daily.  Indication: Manic Phase of Manic-Depression   hydrOXYzine 25 MG tablet Commonly known as: ATARAX Take 1 tablet (25 mg total) by mouth every 8 (eight) hours as needed for anxiety. What changed:  how much to take when to take  this reasons to take this additional instructions  Indication: Feeling Anxious   LORazepam 1 MG tablet Commonly known as: ATIVAN Take 1 tablet (1 mg total) by mouth daily. Start taking on: Feb 12, 2023  Indication: Alcohol Withdrawal Syndrome   nicotine polacrilex 2 MG gum Commonly known as: NICORETTE Take 1 each (2 mg total) by mouth as needed for smoking cessation.  Indication: Nicotine Addiction   paliperidone 3 MG 24 hr tablet Commonly known as: INVEGA Take 1 tablet (3 mg total) by mouth daily. Start taking on: Feb 12, 2023  Indication: mood stabilization   propranolol 10 MG tablet Commonly known as: INDERAL Take 1 tablet (10 mg total) by mouth 2 (two) times daily.  Indication: Feeling Anxious, anxiety/tachycardia   thiamine 100 MG tablet Commonly known as: Vitamin B-1 Take 1 tablet (100 mg total) by mouth daily. Start taking on: Feb 12, 2023  Indication: Deficiency of Vitamin B1   Vitamin D (Ergocalciferol) 1.25 MG (50000 UNIT) Caps capsule Commonly known as: DRISDOL Take 1 capsule (50,000 Units total) by mouth every 7 (seven) days. Start taking on: Feb 16, 2023  Indication: Vitamin D Deficiency        Follow-up Information     [facility]. Go on 02/13/2023.   Why: You have a scheduled therapy appointment with this provider , this Thursday 02/13/2023 @ 2:30PM. If you would like to see this provider the day of or after you discharge , your provider said you could. Please reach out to her if that is the case. Contact information: [employer]  [place], [place] [phone]  P: [phone] Email: [email or web address]        [facility]. Go on 02/18/2023.   Specialty: Urgent Care Why: You have a IN-PERSON medication management appointment with this provider on May 7th @ 10:00 AM. Please go to the second floor and provide them with your name and date of birth for appointment. Contact  information: [address] [place] [address] [phone]               Signed: Starleen Blue, NP 02/11/2023, 2:10 PM

## 2023-02-11 NOTE — Progress Notes (Signed)
Patient discharged to home via private car. Denies si, a/v hallucinations. Verbalized understanding of all discharge information. Given medications, prescriptions upon discharge. Confirmed all personal items returned.

## 2023-02-11 NOTE — [facility] Suicide Risk Assessment (Signed)
Suicide Risk Assessment  Discharge Assessment    [facility] Discharge Suicide Risk Assessment   Principal Problem: Bipolar affective disorder, currently manic, severe, with psychotic features [facility]) Discharge Diagnoses: Principal Problem:   Bipolar affective disorder, currently manic, severe, with psychotic features (HCC) Active Problems:   GAD (generalized anxiety disorder)   Insomnia   Alcohol use disorder   Tetrahydrocannabinol (THC) use disorder, mild, abuse  Reason For Admission: Carolyn York is a 22 yo female with a prior mental health diagnosis of bipolar who presented to the [facility] ER with +SI with plan to overdose & psychosis; reported VH of her boyfriend killing himself & +AH of voices stating that she is next to die. The above was in the context of medication non adherence and ETOH & THC abuse. Pt was involuntarily committed prior to being transferred to this [facility] for treatment and stabilization of her mental status.   Hospital Course: During the patient's hospitalization, patient had extensive initial psychiatric evaluation, and follow-up psychiatric evaluations every day.  Psychiatric diagnoses provided upon initial assessment are as listed above.  Patient's psychiatric medications were adjusted on admission as follows:  -Start Invega 1.5 mg daily for psychosis/mood stabilization -Start Ativan taper for alcohol use d/o-See MAR for complete order -Start Hydroxyzine 25 mg TID PRN for anxiety -Start Depakote 250 mg x 3 doses, and increase to 500 mg nightly for mood stabilization -Reduce Agitation Protocol Medications as follows: Benadryl from 50 mg to 25 mg PRN, Ativan from 2 mg to 1 mg PRN & Continue Haldol 5 mg PRN  During the hospitalization, other adjustments were made to the patient's psychiatric medication regimen. Medications at discharge are as follows:  -Pt was agreeable to and given Invega Sustenna 156 mg x 1 dose on 4/29. Next dose of 117 mg will be given on  02/18/2023 @ 10.00 Am at the [facility] center. Pt to pick up the injection from her pharmacy and take it with her to the appt. -Continue Invega 3 mg daily x 14 days, then stop and continue with monthly LAI injection (117mg  monthly). -Continue Inderal 10 mg BID for tachycardia/anxiety -Continue Vitamin D 50.000 units weekly for low Vit D of 13.29 -Continue Ativan taper for alcohol use d/o-See MAR for complete order -Continue Hydroxyzine 25 mg TID PRN for anxiety -Continue Depakote to 250 mg nightly  (check level outpatient on 5/7)  Patient's care was discussed during the interdisciplinary team meeting every day during the hospitalization. The patient denies having side effects to prescribed psychiatric medication. Gradually, patient started adjusting to milieu. The patient was evaluated each day by a clinical provider to ascertain response to treatment. Improvement was noted by the patient's report of decreasing symptoms, improved sleep and appetite, affect, medication tolerance, behavior, and participation in unit programming.  Patient was asked each day to complete a self inventory noting mood, mental status, pain, new symptoms, anxiety and concerns.    Symptoms were reported as significantly decreased or resolved completely by discharge. On day of discharge, the patient reports that their mood is stable. The patient denied having suicidal thoughts for more than 48 hours prior to discharge.  Patient denies having homicidal thoughts.  Patient denies having auditory hallucinations.  Patient denies any visual hallucinations or other symptoms of psychosis. The patient was motivated to continue taking medication with a goal of continued improvement in mental health.   The patient reports their target psychiatric symptoms of depression, anxiety, insomnia responded well to the psychiatric medications, and  the patient reports overall benefit from this psychiatric hospitalization.  Supportive psychotherapy was provided to the patient. The patient also participated in regular group therapy while hospitalized. Coping skills, problem solving as well as relaxation therapies were also part of the unit programming.  Labs were reviewed with the patient, and abnormal results were discussed with the patient.  The patient is able to verbalize their individual safety plan to this provider.  # It is recommended to the patient to continue psychiatric medications as prescribed, after discharge from the hospital.    # It is recommended to the patient to follow up with your outpatient psychiatric provider and PCP.  # It was discussed with the patient, the impact of alcohol, drugs, tobacco have been there overall psychiatric and medical wellbeing, and total abstinence from substance use was recommended the patient.ed.  # Prescriptions provided or sent directly to preferred pharmacy at discharge. Patient agreeable to plan. Given opportunity to ask questions. Appears to feel comfortable with discharge.    # In the event of worsening symptoms, the patient is instructed to call the crisis hotline (988), 911 and or go to the nearest ED for appropriate evaluation and treatment of symptoms. To follow-up with primary care provider for other medical issues, concerns and or health care needs  # Patient was [facility] with a plan to follow up as noted below.   Total Time spent with patient: 45 minutes  Musculoskeletal: Strength & Muscle Tone: within normal limits Gait & Station: normal Patient leans: N/A  Psychiatric Specialty Exam  Presentation  General Appearance:  Appropriate for Environment; Fairly Groomed  Eye Contact: Good  Speech: Clear and Coherent  Speech Volume: Normal  Handedness: Right   Mood and Affect  Mood: Euthymic  Duration of Depression Symptoms: Greater than two weeks  Affect: Congruent; Appropriate  Thought Process   Thought Processes: Coherent  Descriptions of Associations:Intact  Orientation:Full (Time, Place and Person)  Thought Content:Logical  History of Schizophrenia/Schizoaffective disorder:No  Duration of Psychotic Symptoms:Greater than six months  Hallucinations:Hallucinations: None  Ideas of Reference:None  Suicidal Thoughts:Suicidal Thoughts: No  Homicidal Thoughts:Homicidal Thoughts: No   Sensorium  Memory: Immediate Good  Judgment: Good  Insight: Good   Executive Functions  Concentration: Good  Attention Span: Good  Recall: Good  Fund of Knowledge: Good  Language: Good   Psychomotor Activity  Psychomotor Activity: Psychomotor Activity: Normal   Assets  Assets: Communication Skills   Sleep  Sleep: Sleep: Good   Physical Exam: Physical Exam Constitutional:      Appearance: Normal appearance.  HENT:     Head: Normocephalic.     Nose: No congestion.  Eyes:     Pupils: Pupils are equal, round, and reactive to light.  Pulmonary:     Effort: Pulmonary effort is normal.  Musculoskeletal:        General: Normal range of motion.     Cervical back: Normal range of motion.  Skin:    General: Skin is warm.  Neurological:     General: No focal deficit present.     Mental Status: She is alert.  Psychiatric:        Thought Content: Thought content normal.    Review of Systems  Constitutional:  Negative for fever.  HENT:  Negative for hearing loss.   Eyes:  Negative for blurred vision.  Respiratory:  Negative for cough.   Cardiovascular:  Negative for chest pain.  Gastrointestinal:  Negative for heartburn.  Genitourinary:  Negative for dysuria.  Skin:  Negative for rash.  Neurological:  Positive for dizziness.  Psychiatric/Behavioral:  Positive for depression (Denies SI/HI/AVH, no signs of paranoia or psychosis and verbally contracts for safety outside of [facility]) and substance abuse (Educated on the negative impacts of substance use on  her mental health and on the need to abstain). Negative for hallucinations, memory loss and suicidal ideas. The patient is nervous/anxious (resolving on current medications) and has insomnia (resolving on current medications).    Blood pressure (!) 129/95, pulse 82, temperature 98.3 F (36.8 C), temperature source Oral, resp. rate 16, height 5' 0.5" (1.537 m), weight 40.6 kg, last menstrual period 02/07/2023, SpO2 100 %. Body mass index is 17.21 kg/m.  Mental Status Per Nursing Assessment::   On Admission:  NA  Demographic Factors:  Adolescent or young adult, Living alone, and Unemployed  Loss Factors: NA  Historical Factors: Impulsivity  Risk Reduction Factors:   Responsible for children under 74 years of age, Sense of responsibility to family, Religious beliefs about death, and Positive social support  Continued Clinical Symptoms:  Objectively and subjectively, symptoms have significantly improved since admission. Pt denies SI, denies HI, denies AVH, denies paranoia and verbally contracts for safety outside this [place].  Cognitive Features That Contribute To Risk:  None    Suicide Risk:  Mild:  There are no identifiable suicide plans, no associated intent, mild dysphoria and related symptoms, good self-control (both objective and subjective assessment), few other risk factors, and identifiable protective factors, including available and accessible social support.    Follow-up Information     [facility]. Go on 02/13/2023.   Why: You have a scheduled therapy appointment with this provider , this Thursday 02/13/2023 @ 2:30PM. If you would like to see this provider the day of or after you discharge , your provider said you could. Please reach out to her if that is the case. Contact information: [employer]  [place], [place] [phone]  P: [phone] Email: [email or web address]        [facility] Go on 02/18/2023.   Specialty: Urgent Care Why: You have a IN-PERSON medication management appointment with this provider on May 7th @ 10:00 AM. Please go to the second floor and provide them with your name and date of birth for appointment. Contact information: [address] [place] [address] [phone]               Starleen Blue, NP 02/11/2023, 10:06 AM

## 2023-02-11 NOTE — Group Note (Signed)
Recreation Therapy Group Note   Group Topic:Animal Assisted Therapy   Group Date: 02/11/2023 Start Time: 0945 End Time: 1025 Facilitators: Vonceil Upshur, Benito Mccreedy, LRT Location: [address] Dayroom  Animal-Assisted Activity (AAA) Program Checklist/Progress Note Patient Eligibility Criteria Checklist & Daily Group note for Rec Tx Intervention   AAA/T Program Assumption of Risk Form signed by Patient/ or Parent Legal Guardian YES  Patient is free of allergies or severe asthma  YES  Patient reports no fear of animals YES  Patient reports no history of cruelty to animals YES  Patient understands their participation is voluntary YES  Patient washes hands before animal contact YES  Patient washes hands after animal contact YES   Group Description: Patients provided opportunity to interact with trained and credentialed [facility] [employer] Therapy dog and the community volunteer/dog handler. Patients practiced appropriate animal interaction and were educated on dog safety outside of the hospital in common community settings. Patients were allowed to use dog toys and other items to practice commands, engage the dog in play, and/or complete routine aspects of animal care.   Education: Charity fundraiser, Health visitor, Communication & Social Skills   Affect/Mood: Appropriate and Congruent   Participation Level: Engaged   Participation Quality: Independent   Behavior: Attentive , Cooperative, and Interactive    Speech/Thought Process: Directed and Oriented   Modes of Intervention: Activity, Teaching laboratory technician, and Socialization   Patient Response to Interventions:  Receptive   Education Outcome:  Acknowledges education   Clinical Observations/Individualized Feedback: Patient attended session and interacted appropriately with the therapy dog and peers. Pt spontaneously shared stories about their experiences with animals. 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   Benito Mccreedy Aarilyn Dye, LRT, CTRS 02/11/2023 2:44 PM

## 2023-02-11 NOTE — Progress Notes (Signed)
  [facility] Adult Case Management Discharge Plan :  Will you be returning to the same living situation after discharge:  Yes,  Patient will be returning back to her apartment where he mom is currently  At discharge, do you have transportation home?: Yes,  Patient mom will be picking her up around 11:15 AM  Do you have the ability to pay for your medications: Yes,  Pt has Generic Commercial insurance    Release of information consent forms completed and in the chart;  Patient's signature needed at discharge.  Patient to Follow up at:  Follow-up Information     [facility]. Go on 02/13/2023.   Why: You have a scheduled therapy appointment with this provider , this Thursday 02/13/2023 @ 2:30PM. If you would like to see this provider the day of or after you discharge , your provider said you could. Please reach out to her if that is the case. Contact information: [employer]  [place], [place] [phone]) [phone]  P: [phone] Email: [email or web address]        [facility]. Go on 02/18/2023.   Specialty: Urgent Care Why: You have a IN-PERSON medication management appointment with this provider on May 7th @ 10:00 AM. Please go to the second floor and provide them with your name and date of birth for appointment. Contact information: [address] [place] [address] [phone]                Next level of care provider has access to [facility] Link:yes  Safety Planning and Suicide Prevention discussed: Yes,  Sholanda Croson ( mom ) [phone]     Has patient been referred to the Quitline?: Patient refused referral. States that she just stopped smoking marijuana two weeks ago   Patient has been referred for addiction treatment: N/A. Patient states that she does not have an alcohol problem.   Isabella Bowens, LCSWA 02/11/2023, 9:37 AM

## 2023-02-18 ENCOUNTER — Ambulatory Visit (INDEPENDENT_AMBULATORY_CARE_PROVIDER_SITE_OTHER): Payer: PRIVATE HEALTH INSURANCE | Admitting: Psychiatry

## 2023-02-18 ENCOUNTER — Encounter (HOSPITAL_COMMUNITY): Payer: Self-pay

## 2023-02-18 ENCOUNTER — Ambulatory Visit (INDEPENDENT_AMBULATORY_CARE_PROVIDER_SITE_OTHER): Payer: PRIVATE HEALTH INSURANCE | Admitting: *Deleted

## 2023-02-18 VITALS — BP 114/76 | HR 70 | Resp 16 | Ht 60.0 in | Wt 95.6 lb

## 2023-02-18 DIAGNOSIS — F4310 Post-traumatic stress disorder, unspecified: Secondary | ICD-10-CM | POA: Diagnosis not present

## 2023-02-18 DIAGNOSIS — F312 Bipolar disorder, current episode manic severe with psychotic features: Secondary | ICD-10-CM

## 2023-02-18 MED ORDER — PROPRANOLOL HCL 10 MG PO TABS
10.0000 mg | ORAL_TABLET | Freq: Two times a day (BID) | ORAL | 3 refills | Status: DC
Start: 2023-02-18 — End: 2023-03-18

## 2023-02-18 MED ORDER — NICOTINE POLACRILEX 2 MG MT GUM
2.0000 mg | CHEWING_GUM | OROMUCOSAL | 3 refills | Status: DC | PRN
Start: 2023-02-18 — End: 2023-03-18

## 2023-02-18 MED ORDER — DIVALPROEX SODIUM ER 500 MG PO TB24
500.0000 mg | ORAL_TABLET | Freq: Every day | ORAL | 3 refills | Status: DC
Start: 2023-02-18 — End: 2023-03-18

## 2023-02-18 MED ORDER — INVEGA SUSTENNA 117 MG/0.75ML IM SUSY
117.0000 mg | PREFILLED_SYRINGE | INTRAMUSCULAR | 11 refills | Status: DC
Start: 2023-02-18 — End: 2023-03-18

## 2023-02-18 MED ORDER — PALIPERIDONE PALMITATE ER 117 MG/0.75ML IM SUSY
117.0000 mg | PREFILLED_SYRINGE | Freq: Once | INTRAMUSCULAR | Status: AC
Start: 2023-02-18 — End: 2023-02-18
  Administered 2023-02-18: 117 mg via INTRAMUSCULAR

## 2023-02-18 MED ORDER — HYDROXYZINE HCL 25 MG PO TABS
25.0000 mg | ORAL_TABLET | Freq: Three times a day (TID) | ORAL | 3 refills | Status: DC | PRN
Start: 2023-02-18 — End: 2023-03-18

## 2023-02-18 NOTE — Progress Notes (Signed)
Patient arrived for injection of [place] 117mg  given in Right Deltoid without issues or complaints. Pleasant, cooperative. Denies SI/HI or AV hallucinations.
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
## 2023-02-24 ENCOUNTER — Other Ambulatory Visit (HOSPITAL_COMMUNITY): Payer: PRIVATE HEALTH INSURANCE

## 2023-02-24 ENCOUNTER — Ambulatory Visit (INDEPENDENT_AMBULATORY_CARE_PROVIDER_SITE_OTHER): Payer: PRIVATE HEALTH INSURANCE | Admitting: Psychiatry

## 2023-02-24 ENCOUNTER — Encounter (HOSPITAL_COMMUNITY): Payer: Self-pay | Admitting: Psychiatry

## 2023-02-24 DIAGNOSIS — F312 Bipolar disorder, current episode manic severe with psychotic features: Secondary | ICD-10-CM

## 2023-02-24 DIAGNOSIS — Z79899 Other long term (current) drug therapy: Secondary | ICD-10-CM

## 2023-02-24 DIAGNOSIS — F4310 Post-traumatic stress disorder, unspecified: Secondary | ICD-10-CM

## 2023-02-24 MED ORDER — PRAZOSIN HCL 1 MG PO CAPS
1.0000 mg | ORAL_CAPSULE | Freq: Every day | ORAL | 3 refills | Status: DC
Start: 2023-02-24 — End: 2023-03-18

## 2023-02-24 MED ORDER — MIRTAZAPINE 7.5 MG PO TABS
7.5000 mg | ORAL_TABLET | Freq: Every day | ORAL | 3 refills | Status: DC
Start: 2023-02-24 — End: 2023-03-18

## 2023-02-24 NOTE — Progress Notes (Cosign Needed)
Patient in today for ordered labs and presented with appropriate affect, level and pleasant mood and denied any current auditory or visual hallucinations, no suicidal or homicidal ideations and no complaints.  Lab  orders drawn as directed from patient's right arm.  No complaints of pain or discomfort and patient left without any issues.  Labs sent to [facility] for resulting.
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
## 2023-02-25 LAB — COMPREHENSIVE METABOLIC PANEL
ALT: 10 IU/L (ref 0–32)
AST: 21 IU/L (ref 0–40)
Albumin/Globulin Ratio: 1.7 (ref 1.2–2.2)
Albumin: 4.1 g/dL (ref 4.0–5.0)
Alkaline Phosphatase: 46 IU/L (ref 44–121)
BUN/Creatinine Ratio: 6 — ABNORMAL LOW (ref 9–23)
BUN: 5 mg/dL — ABNORMAL LOW (ref 6–20)
Bilirubin Total: 0.4 mg/dL (ref 0.0–1.2)
CO2: 19 mmol/L — ABNORMAL LOW (ref 20–29)
Calcium: 9 mg/dL (ref 8.7–10.2)
Chloride: 99 mmol/L (ref 96–106)
Creatinine, Ser: 0.83 mg/dL (ref 0.57–1.00)
Globulin, Total: 2.4 g/dL (ref 1.5–4.5)
Glucose: 67 mg/dL — ABNORMAL LOW (ref 70–99)
Potassium: 4.5 mmol/L (ref 3.5–5.2)
Sodium: 135 mmol/L (ref 134–144)
Total Protein: 6.5 g/dL (ref 6.0–8.5)
eGFR: 102 mL/min/{1.73_m2} (ref >59)

## 2023-02-25 LAB — LIPID PANEL
Chol/HDL Ratio: 3 ratio (ref 0.0–4.4)
Cholesterol, Total: 154 mg/dL (ref 100–199)
HDL: 52 mg/dL (ref >39)
LDL Chol Calc (NIH): 88 mg/dL (ref 0–99)
Triglycerides: 71 mg/dL (ref 0–149)
VLDL Cholesterol Cal: 14 mg/dL (ref 5–40)

## 2023-02-25 LAB — CBC WITH DIFFERENTIAL/PLATELET
Basophils Absolute: 0.1 10*3/uL (ref 0.0–0.2)
Basos: 1 %
EOS (ABSOLUTE): 0.3 10*3/uL (ref 0.0–0.4)
Eos: 4 %
Hematocrit: 38.9 % (ref 34.0–46.6)
Hemoglobin: 12.8 g/dL (ref 11.1–15.9)
Immature Grans (Abs): 0.1 10*3/uL (ref 0.0–0.1)
Immature Granulocytes: 1 %
Lymphocytes Absolute: 2.2 10*3/uL (ref 0.7–3.1)
Lymphs: 30 %
MCH: 30.5 pg (ref 26.6–33.0)
MCHC: 32.9 g/dL (ref 31.5–35.7)
MCV: 93 fL (ref 79–97)
Monocytes Absolute: 0.6 10*3/uL (ref 0.1–0.9)
Monocytes: 8 %
Neutrophils Absolute: 4.1 10*3/uL (ref 1.4–7.0)
Neutrophils: 56 %
Platelets: 307 10*3/uL (ref 150–450)
RBC: 4.19 x10E6/uL (ref 3.77–5.28)
RDW: 13.6 % (ref 11.7–15.4)
WBC: 7.3 10*3/uL (ref 3.4–10.8)

## 2023-02-25 LAB — THYROID PANEL WITH TSH
Free Thyroxine Index: 2 (ref 1.2–4.9)
T3 Uptake Ratio: 27 % (ref 24–39)
T4, Total: 7.3 ug/dL (ref 4.5–12.0)
TSH: 1.18 u[IU]/mL (ref 0.450–4.500)

## 2023-02-25 LAB — HEMOGLOBIN A1C
Est. average glucose Bld gHb Est-mCnc: 105 mg/dL
Hgb A1c MFr Bld: 5.3 % (ref 4.8–5.6)

## 2023-02-25 LAB — VALPROIC ACID LEVEL: Valproic Acid Lvl: 57 ug/mL (ref 50–100)

## 2023-02-25 LAB — HEPATIC FUNCTION PANEL: Bilirubin, Direct: 0.11 mg/dL (ref 0.00–0.40)

## 2023-03-03 ENCOUNTER — Telehealth (HOSPITAL_COMMUNITY): Payer: Self-pay | Admitting: Psychiatry

## 2023-03-03 NOTE — Telephone Encounter (Signed)
Patient informed Clinical research associate that she discontinued propranolol and is having increased akathisia.  Patient was recently started on prazosin and provider discussed risks and benefits of being on prazosin and propranolol.  At this time patient notes that she will restart propranolol 10 mg twice daily.  Provider informed patient that if she begins to feel dizzy or lightheaded to taper propranolol to 10 mg once daily instead of twice daily.  She endorsed understanding and agreed.  Provider also informed patient that she can call the clinic if needed for further instruction or be seen in the ED if symptoms progressively worsen.  She endorsed understanding and agreed.  No other concerns at this time.

## 2023-03-03 NOTE — Progress Notes (Signed)
Provider called and discussed patient labs results with her.  She endorsed understanding and agreed.  She reports that mentally she feels stable.  She notes that she is in [place] currently but will be coming back to [place] soon.  She request that her next appointment be virtual as she is unsure if she will be back in [place].  Provider was agreeable to this.  No other concerns at this time.

## 2023-03-18 ENCOUNTER — Encounter (HOSPITAL_COMMUNITY): Payer: Self-pay | Admitting: Psychiatry

## 2023-03-18 ENCOUNTER — Ambulatory Visit (HOSPITAL_COMMUNITY): Payer: PRIVATE HEALTH INSURANCE

## 2023-03-18 ENCOUNTER — Ambulatory Visit (INDEPENDENT_AMBULATORY_CARE_PROVIDER_SITE_OTHER): Payer: PRIVATE HEALTH INSURANCE | Admitting: Psychiatry

## 2023-03-18 DIAGNOSIS — F312 Bipolar disorder, current episode manic severe with psychotic features: Secondary | ICD-10-CM | POA: Diagnosis not present

## 2023-03-18 DIAGNOSIS — F4310 Post-traumatic stress disorder, unspecified: Secondary | ICD-10-CM

## 2023-03-18 MED ORDER — PALIPERIDONE ER 3 MG PO TB24
3.0000 mg | ORAL_TABLET | Freq: Every day | ORAL | 3 refills | Status: DC
Start: 2023-03-18 — End: 2023-04-15

## 2023-03-18 MED ORDER — MIRTAZAPINE 15 MG PO TABS: 15.0000 mg | ORAL_TABLET | Freq: Every day | ORAL | 3 refills | Status: DC

## 2023-03-18 MED ORDER — HYDROXYZINE HCL 25 MG PO TABS
25.0000 mg | ORAL_TABLET | Freq: Three times a day (TID) | ORAL | 3 refills | Status: DC | PRN
Start: 2023-03-18 — End: 2023-04-15

## 2023-03-18 MED ORDER — PRAZOSIN HCL 1 MG PO CAPS: 1.0000 mg | ORAL_CAPSULE | Freq: Every day | ORAL | 3 refills | Status: DC

## 2023-03-18 MED ORDER — PROPRANOLOL HCL 10 MG PO TABS
10.0000 mg | ORAL_TABLET | Freq: Every day | ORAL | 3 refills | Status: DC
Start: 2023-03-18 — End: 2023-04-15

## 2023-03-18 MED ORDER — DIVALPROEX SODIUM ER 500 MG PO TB24
500.0000 mg | ORAL_TABLET | Freq: Every day | ORAL | 3 refills | Status: DC
Start: 2023-03-18 — End: 2023-04-15

## 2023-03-18 NOTE — Progress Notes (Signed)
BH MD/PA/NP OP Progress Note  03/18/2023 9:27 AM Carolyn York  MRN:  [id number]  Chief Complaint: "I am feeling better"  HPI: 22 year old female seen today for follow-up psychiatric evaluation.  She walked into the clinic for medication management.  She has a psychiatric history of bipolar disorder, anxiety, alcohol use disorder, and cannabis use.  Currently she is managed on Depakote 500 mg nightly, hydroxyzine 25 mg every 8 hours, Nicorette gum 2 mg as needed, Invega 117 mg monthly, and prazosin 1 mg nightly. Patient notes that she has restarted propranolol 10 mg twice daily. She informed Clinical research associate that she has also discontinued nicorette gum. She reports that her other medications are somewhat effective in managing her psychiatric conditions.   Today she was well-groomed, pleasant, cooperative, and engaged in conversation.  She informed Clinical research associate that she is feels t somewhat better. She notes that she at times lacks motivation to do thing she once enjoyed. She also notes that her concentration has been poor. Since her last visit she reports that her anxiety and depression has improved.  Today provider conducted a GAD-7 and patient scored a 8, at her last visit she scored a 14.  Provider also conducted PHQ-9 and patient scored a 9, at her last visit she scored an 35.  She endorses adequate sleep since starting Mirtazapine.  She also notes that her appetite has increased. She reports that she has gained 5-10 pounds. Today she denies SI/HI/VAH, mania, or paranoia.  She reports that her appetite is adequate.  Patient informed Clinical research associate that she lives between 3 states.  She graduated from [employer] and travel home to [place] frequently.  Recently she informed Clinical research associate that she got a job in [place] and will be relocating in July.  Provider informed patient that care cannot be continued when she leaves to stay permanently.  She endorsed understanding.  Provider encouraged patient to start seeking mental  health providers.  She endorsed understanding and agreed.  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  Patient informed Clinical research associate that she is unsure how she will get her injection.  Provider informed patient that she would have to discuss this with her providers in [place].    Patient informed writer that starting prazosin for nightmares have subsided.  She also informed writer that when she stopped propranolol she became restless and experience akathisia.  Patient notes that she restarted propranolol 10 mg twice daily.  Patient's blood pressure is notably low today at 99/57.  Blood pressure was taken a second time and her readings are 100/70.  Provider informed patient that taking propranolol and prazosin in combination can lower her blood pressure.  She endorsed understanding however notes that she wanted to continue them both.  Provider recommended taking propranolol once daily instead of twice daily.  She endorsed understanding and agreed.  Mirtazapine 7.5 mg increased to 15 mg to help manage anxiety and depression.  Nicorette gum discontinued.  Patient informed Clinical research associate that she is uncertain if she will be able to  continue Invega LAI in [place].  She requested oral Invega.  Today Invega 3 mg daily prescribed.  She will continue all other medications as prescribed. No other concerns noted at this time. Visit Diagnosis:    ICD-10-CM   1. Bipolar affective disorder, currently manic, severe, with psychotic features (HCC)  F31.2 divalproex (DEPAKOTE ER) 500 MG 24 hr tablet    hydrOXYzine (ATARAX) 25 MG tablet    propranolol (INDERAL) 10  MG tablet    paliperidone (INVEGA) 3 MG 24 hr tablet    2. PTSD (post-traumatic stress disorder)  F43.10 mirtazapine (REMERON) 15 MG tablet    prazosin (MINIPRESS) 1 MG capsule      Past Psychiatric History: Bipolar disorder, anxiety, alcohol use disorder, cannabis use   Past Medical History:  Past Medical History:   Diagnosis Date   Bipolar disorder [facility])     Past Surgical History:  Procedure Laterality Date   WISDOM TOOTH EXTRACTION      Family Psychiatric History: Mother alcohol use, father alcohol use, sister marijuana use   Family History: No family history on file.  Social History:  Social History   Socioeconomic History   Marital status: Single    Spouse name: Not on file   Number of children: Not on file   Years of education: Not on file   Highest education level: Not on file  Occupational History   Not on file  Tobacco Use   Smoking status: Never   Smokeless tobacco: Never  Vaping Use   Vaping Use: Every day  Substance and Sexual Activity   Alcohol use: Yes    Comment: 1/5 liquor a week   Drug use: Not Currently    Types: Marijuana   Sexual activity: Not on file  Other Topics Concern   Not on file  Social History Narrative   Not on file   Social Determinants of Health   Financial Resource Strain: Not on file  Food Insecurity: Food Insecurity Present (02/07/2023)   Hunger Vital Sign    Worried About Running Out of Food in the Last Year: Sometimes true    Ran Out of Food in the Last Year: Sometimes true  Transportation Needs: No Transportation Needs (02/07/2023)   PRAPARE - Administrator, Civil Service (Medical): No    Lack of Transportation (Non-Medical): No  Physical Activity: Not on file  Stress: Not on file  Social Connections: Not on file    Allergies:  Allergies  Allergen Reactions   Monistat [Miconazole]    Other Hives    amoxicillin    Metabolic Disorder Labs: Lab Results  Component Value Date   HGBA1C 5.3 02/24/2023   MPG 93.93 02/09/2023   No results found for: "PROLACTIN" Lab Results  Component Value Date   CHOL 154 02/24/2023   TRIG 71 02/24/2023   HDL 52 02/24/2023   CHOLHDL 3.0 02/24/2023   VLDL 14 02/09/2023   LDLCALC 88 02/24/2023   LDLCALC 92 02/09/2023   Lab Results  Component Value Date   TSH 1.180 02/24/2023    TSH 1.787 02/09/2023    Therapeutic Level Labs: No results found for: "LITHIUM" Lab Results  Component Value Date   VALPROATE 57 02/24/2023   No results found for: "CBMZ"  Current Medications: Current Outpatient Medications  Medication Sig Dispense Refill   paliperidone (INVEGA) 3 MG 24 hr tablet Take 1 tablet (3 mg total) by mouth daily. 30 tablet 3   divalproex (DEPAKOTE ER) 500 MG 24 hr tablet Take 1 tablet (500 mg total) by mouth daily. 30 tablet 3   hydrOXYzine (ATARAX) 25 MG tablet Take 1 tablet (25 mg total) by mouth every 8 (eight) hours as needed for anxiety. 90 tablet 3   LORazepam (ATIVAN) 1 MG tablet Take 1 tablet (1 mg total) by mouth daily. 1 tablet 0   mirtazapine (REMERON) 15 MG tablet Take 1 tablet (15 mg total) by mouth at bedtime. 30 tablet 3  prazosin (MINIPRESS) 1 MG capsule Take 1 capsule (1 mg total) by mouth at bedtime. 30 capsule 3   propranolol (INDERAL) 10 MG tablet Take 1 tablet (10 mg total) by mouth daily. 30 tablet 3   thiamine (VITAMIN B-1) 100 MG tablet Take 1 tablet (100 mg total) by mouth daily. 30 tablet 0   Vitamin D, Ergocalciferol, (DRISDOL) 1.25 MG (50000 UNIT) CAPS capsule Take 1 capsule (50,000 Units total) by mouth every 7 (seven) days. 5 capsule 0   No current facility-administered medications for this visit.     Musculoskeletal: Strength & Muscle Tone: within normal limits Gait & Station: normal Patient leans: N/A  Psychiatric Specialty Exam: Review of Systems  Blood pressure (!) 99/57, pulse (!) 57, temperature 98.7 F (37.1 C), weight 104 lb 3.2 oz (47.3 kg), SpO2 100 %.Body mass index is 20.35 kg/m.  General Appearance: Well Groomed  Eye Contact:  Good  Speech:  Clear and Coherent and Normal Rate  Volume:  Normal  Mood:  Euthymic  Affect:  Appropriate and Congruent  Thought Process:  Coherent, Goal Directed, and Linear  Orientation:  Full (Time, Place, and Person)  Thought Content: WDL and Logical   Suicidal Thoughts:   No  Homicidal Thoughts:  No  Memory:  Immediate;   Good Recent;   Good Remote;   Good  Judgement:  Good  Insight:  Good  Psychomotor Activity:  Normal  Concentration:  Concentration: Good and Attention Span: Good  Recall:  Good  Fund of Knowledge: Good  Language: Good  Akathisia:  No  Handed:  Right  AIMS (if indicated): not done  Assets:  Communication Skills Desire for Improvement Financial Resources/Insurance Housing Physical Health Social Support Vocational/Educational  ADL's:  Intact  Cognition: WNL  Sleep:  Good   Screenings: AIMS    Flowsheet Row Clinical Support from 02/24/2023 in [facility] Clinical Support from 02/18/2023 in [facility]  AIMS Total Score 0 0      AUDIT    Flowsheet Row Clinical Support from 02/18/2023 in [facility] (Dis[facility]  Alcohol Use Disorder Identification Test Final Score (AUDIT) 28 23      GAD-7    Flowsheet Row Clinical Support from 03/18/2023 in [facility] Clinical Support from 02/24/2023 in [facility] Clinical Support from 02/18/2023 in [facility]  Total GAD-7 Score 8 14 6       PHQ2-9    Flowsheet Row Clinical Support from 03/18/2023 in [facility] Clinical Support from 02/24/2023 in [facility] Clinical Support from 02/18/2023 in [facility]  PHQ-2 Total Score 3 4 2   PHQ-9 Total Score 9 19 11       Flowsheet Row Clinical Support from 03/18/2023 in [facility] Clinical Support from 02/24/2023 in [facility] Clinical Support from 02/18/2023 in [facility]  C-SSRS RISK CATEGORY Error: Q3, 4, or 5 should not be populated when  Q2 is No Error: Q3, 4, or 5 should not be populated when Q2 is No Error: Question 1 not populated        Assessment and Plan: Patient reports that her anxiety and depression are improving however notes that she continues to lack motivation and had poor concentration. Patient informed writer that starting prazosin for nightmares have subsided.  She also informed writer that when she stopped propranolol she became restless and experience akathisia.  Patient notes that she restarted propranolol 10 mg twice daily.  Patient's blood pressure is notably low today at 99/57.  Blood pressure was taken a second time and her readings are 100/70.  Provider informed patient that taking propranolol and prazosin in combination can lower her blood pressure.  She endorsed understanding however notes that she wanted to continue them both.  Provider recommended taking propranolol once daily instead of twice daily.  She endorsed understanding and agreed.  Mirtazapine 7.5 mg increased to 15 mg to help manage anxiety and depression.  Nicorette gum discontinued.  Patient informed Clinical research associate that she is uncertain if she will be able to  continue Invega LAI in [place].  She requested oral Invega.  Today Invega 3 mg daily prescribed.  She will continue all other medications as prescribed   1. Bipolar affective disorder, currently manic, severe, with psychotic features (HCC)  Continue- divalproex (DEPAKOTE ER) 500 MG 24 hr tablet; Take 1 tablet (500 mg total) by mouth daily.  Dispense: 30 tablet; Refill: 3 Continue- hydrOXYzine (ATARAX) 25 MG tablet; Take 1 tablet (25 mg total) by mouth every 8 (eight) hours as needed for anxiety.  Dispense: 90 tablet; Refill: 3 Reduced- propranolol (INDERAL) 10 MG tablet; Take 1 tablet (10 mg total) by mouth daily.  Dispense: 30 tablet; Refill: 3 Continue- paliperidone (INVEGA) 3 MG 24 hr tablet; Take 1 tablet (3 mg total) by mouth daily.  Dispense: 30 tablet; Refill: 3  2. PTSD (post-traumatic  stress disorder)  Increased- mirtazapine (REMERON) 15 MG tablet; Take 1 tablet (15 mg total) by mouth at bedtime.  Dispense: 30 tablet; Refill: 3 Continue- prazosin (MINIPRESS) 1 MG capsule; Take 1 capsule (1 mg total) by mouth at bedtime.  Dispense: 30 capsule; Refill: 3    Collaboration of Care: Collaboration of Care: Other provider involved in patient's care [facility] clinic staff  Patient/Guardian was advised Release of Information must be obtained prior to any record release in order to collaborate their care with an outside provider. Patient/Guardian was advised if they have not already done so to contact the registration department to sign all necessary forms in order for [place] to release information regarding their care.   Consent: Patient/Guardian gives verbal consent for treatment and assignment of benefits for services provided during this visit. Patient/Guardian expressed understanding and agreed to proceed.   Follow up in 1 months Shanna Cisco, NP 03/18/2023, 9:27 AM

## 2023-04-15 ENCOUNTER — Encounter (HOSPITAL_COMMUNITY): Payer: Self-pay

## 2023-04-15 ENCOUNTER — Telehealth (INDEPENDENT_AMBULATORY_CARE_PROVIDER_SITE_OTHER): Payer: 59 | Admitting: Psychiatry

## 2023-04-15 ENCOUNTER — Ambulatory Visit (HOSPITAL_COMMUNITY): Payer: PRIVATE HEALTH INSURANCE

## 2023-04-15 DIAGNOSIS — F4310 Post-traumatic stress disorder, unspecified: Secondary | ICD-10-CM | POA: Diagnosis not present

## 2023-04-15 DIAGNOSIS — F312 Bipolar disorder, current episode manic severe with psychotic features: Secondary | ICD-10-CM

## 2023-04-15 MED ORDER — PALIPERIDONE ER 3 MG PO TB24
3.0000 mg | ORAL_TABLET | Freq: Every day | ORAL | 3 refills | Status: AC
Start: 2023-04-15 — End: ?

## 2023-04-15 MED ORDER — PROPRANOLOL HCL 10 MG PO TABS
10.0000 mg | ORAL_TABLET | Freq: Every day | ORAL | 3 refills | Status: AC
Start: 2023-04-15 — End: ?

## 2023-04-15 MED ORDER — MIRTAZAPINE 15 MG PO TABS
15.0000 mg | ORAL_TABLET | Freq: Every day | ORAL | 3 refills | Status: AC
Start: 2023-04-15 — End: ?

## 2023-04-15 MED ORDER — HYDROXYZINE HCL 25 MG PO TABS
25.0000 mg | ORAL_TABLET | Freq: Three times a day (TID) | ORAL | 3 refills | Status: AC | PRN
Start: 2023-04-15 — End: ?

## 2023-04-15 MED ORDER — DIVALPROEX SODIUM ER 500 MG PO TB24
500.0000 mg | ORAL_TABLET | Freq: Every day | ORAL | 3 refills | Status: AC
Start: 2023-04-15 — End: ?

## 2023-04-15 MED ORDER — PRAZOSIN HCL 1 MG PO CAPS
1.0000 mg | ORAL_CAPSULE | Freq: Every day | ORAL | 3 refills | Status: AC
Start: 2023-04-15 — End: ?

## 2023-04-15 NOTE — Progress Notes (Signed)
BH MD/PA/NP OP Progress Note  04/15/2023 11:41 AM Carolyn York  MRN:  [id number]  Chief Complaint: "I am moving to [facility]"  HPI: 22 year old female seen today for follow-up psychiatric evaluation.  She walked into the clinic for medication management.  She has a psychiatric history of bipolar disorder, anxiety, alcohol use disorder, and cannabis use.  Currently she is managed on Depakote 500 mg nightly, hydroxyzine 25 mg every 8 hours, Nicorette gum 2 mg as needed, Invega 3 mg daily, Mirtazapine 15 mg nightly, and prazosin 1 mg nightly. Patient notes that she has restarted propranolol 10 mg twice daily. She informed Clinical research associate that she has also discontinued nicorette gum. She reports that her other medications are effective in managing her psychiatric conditions.   Today she was well-groomed, pleasant, cooperative, and engaged in conversation.  She informed Clinical research associate that she is will be moving to [place]. She notes that she now lives in [place]. Patient reports that her mood is stable and notes that she has minimal anxiety and depression. Today provider conducted a GAD-7 and patient scored a 2, at her last visit she scored a 8.  Provider also conducted PHQ-9 and patient scored a 5, at her last visit she scored an 9.  She endorses adequate sleep and appetite. Today she denies SI/HI/VAH, mania, or paranoia.    Patient reports that she is doing well on her current medication regimen. She notes that she will be moving to  [facility]. Today medications refilled and sent to preferred pharmacy.  Patient informed that she will have to follow-up with provider in [place].  She endorsed understanding and agreed. Visit Diagnosis:    ICD-10-CM   1. Bipolar affective disorder, currently manic, severe, with psychotic features (HCC)  F31.2 divalproex (DEPAKOTE ER) 500 MG 24 hr tablet    hydrOXYzine (ATARAX) 25 MG tablet    paliperidone (INVEGA) 3 MG 24 hr tablet    propranolol (INDERAL) 10 MG tablet    2. PTSD  (post-traumatic stress disorder)  F43.10 mirtazapine (REMERON) 15 MG tablet    prazosin (MINIPRESS) 1 MG capsule      Past Psychiatric History: Bipolar disorder, anxiety, alcohol use disorder, cannabis use   Past Medical History:  Past Medical History:  Diagnosis Date   Bipolar disorder [facility])     Past Surgical History:  Procedure Laterality Date   WISDOM TOOTH EXTRACTION      Family Psychiatric History: Mother alcohol use, father alcohol use, sister marijuana use   Family History: History reviewed. No pertinent family history.  Social History:  Social History   Socioeconomic History   Marital status: Single    Spouse name: Not on file   Number of children: Not on file   Years of education: Not on file   Highest education level: Not on file  Occupational History   Not on file  Tobacco Use   Smoking status: Never   Smokeless tobacco: Never  Vaping Use   Vaping Use: Every day  Substance and Sexual Activity   Alcohol use: Yes    Comment: 1/5 liquor a week   Drug use: Not Currently    Types: Marijuana   Sexual activity: Not on file  Other Topics Concern   Not on file  Social History Narrative   Not on file   Social Determinants of Health   Financial Resource Strain: Not on file  Food Insecurity: Food Insecurity Present (02/07/2023)   Hunger Vital Sign    Worried About Running Out of Food in the  Last Year: Sometimes true    Ran Out of Food in the Last Year: Sometimes true  Transportation Needs: No Transportation Needs (02/07/2023)   PRAPARE - Administrator, Civil Service (Medical): No    Lack of Transportation (Non-Medical): No  Physical Activity: Not on file  Stress: Not on file  Social Connections: Not on file    Allergies:  Allergies  Allergen Reactions   Monistat [Miconazole]    Other Hives    amoxicillin    Metabolic Disorder Labs: Lab Results  Component Value Date   HGBA1C 5.3 02/24/2023   MPG 93.93 02/09/2023   No results found  for: "PROLACTIN" Lab Results  Component Value Date   CHOL 154 02/24/2023   TRIG 71 02/24/2023   HDL 52 02/24/2023   CHOLHDL 3.0 02/24/2023   VLDL 14 02/09/2023   LDLCALC 88 02/24/2023   LDLCALC 92 02/09/2023   Lab Results  Component Value Date   TSH 1.180 02/24/2023   TSH 1.787 02/09/2023    Therapeutic Level Labs: No results found for: "LITHIUM" Lab Results  Component Value Date   VALPROATE 57 02/24/2023   No results found for: "CBMZ"  Current Medications: Current Outpatient Medications  Medication Sig Dispense Refill   divalproex (DEPAKOTE ER) 500 MG 24 hr tablet Take 1 tablet (500 mg total) by mouth daily. 30 tablet 3   hydrOXYzine (ATARAX) 25 MG tablet Take 1 tablet (25 mg total) by mouth every 8 (eight) hours as needed for anxiety. 90 tablet 3   LORazepam (ATIVAN) 1 MG tablet Take 1 tablet (1 mg total) by mouth daily. 1 tablet 0   mirtazapine (REMERON) 15 MG tablet Take 1 tablet (15 mg total) by mouth at bedtime. 30 tablet 3   paliperidone (INVEGA) 3 MG 24 hr tablet Take 1 tablet (3 mg total) by mouth daily. 30 tablet 3   prazosin (MINIPRESS) 1 MG capsule Take 1 capsule (1 mg total) by mouth at bedtime. 30 capsule 3   propranolol (INDERAL) 10 MG tablet Take 1 tablet (10 mg total) by mouth daily. 30 tablet 3   thiamine (VITAMIN B-1) 100 MG tablet Take 1 tablet (100 mg total) by mouth daily. 30 tablet 0   Vitamin D, Ergocalciferol, (DRISDOL) 1.25 MG (50000 UNIT) CAPS capsule Take 1 capsule (50,000 Units total) by mouth every 7 (seven) days. 5 capsule 0   No current facility-administered medications for this visit.     Musculoskeletal: Strength & Muscle Tone: within normal limits and Telehealth visit Gait & Station: normal, Telehealth visit Patient leans: N/A  Psychiatric Specialty Exam: Review of Systems  There were no vitals taken for this visit.There is no height or weight on file to calculate BMI.  General Appearance: Well Groomed  Eye Contact:  Good  Speech:   Clear and Coherent and Normal Rate  Volume:  Normal  Mood:  Euthymic  Affect:  Appropriate and Congruent  Thought Process:  Coherent, Goal Directed, and Linear  Orientation:  Full (Time, Place, and Person)  Thought Content: WDL and Logical   Suicidal Thoughts:  No  Homicidal Thoughts:  No  Memory:  Immediate;   Good Recent;   Good Remote;   Good  Judgement:  Good  Insight:  Good  Psychomotor Activity:  Normal  Concentration:  Concentration: Good and Attention Span: Good  Recall:  Good  Fund of Knowledge: Good  Language: Good  Akathisia:  No  Handed:  Right  AIMS (if indicated): not done  Assets:  Communication Skills Desire for Improvement Financial Resources/Insurance Housing Physical Health Social Support Vocational/Educational  ADL's:  Intact  Cognition: WNL  Sleep:  Good   Screenings: AIMS    Flowsheet Row Clinical Support from 02/24/2023 in [facility] Clinical Support from 02/18/2023 in [facility]  AIMS Total Score 0 0      AUDIT    Flowsheet Row Clinical Support from 02/18/2023 in [facility] Admission (Discharged) from [facility]  Alcohol Use Disorder Identification Test Final Score (AUDIT) 28 23      GAD-7    Flowsheet Row Video Visit from 04/15/2023 in [facility] Clinical Support from 03/18/2023 in [facility] Clinical Support from 02/24/2023 in [facility] Clinical Support from 02/18/2023 in [facility]  Total GAD-7 Score 2 8 14 6       PHQ2-9    Flowsheet Row Video Visit from 04/15/2023 in [facility] Clinical Support from 03/18/2023 in [facility] Clinical Support from 02/24/2023 in [facility] Clinical Support from 02/18/2023 in  [facility]  PHQ-2 Total Score 2 3 4 2   PHQ-9 Total Score 5 9 19 11       Flowsheet Row Video Visit from 04/15/2023 in [facility] Clinical Support from 03/18/2023 in [facility] Clinical Support from 02/24/2023 in [facility]  C-SSRS RISK CATEGORY Error: Q3, 4, or 5 should not be populated when Q2 is No Error: Q3, 4, or 5 should not be populated when Q2 is No Error: Q3, 4, or 5 should not be populated when Q2 is No        Assessment and Plan: Patient reports that she is doing well on her current medication regimen. She notes that she will be moving to  [facility]. Today medications refilled and sent to preferred pharmacy.  Patient informed that she will have to follow-up with provider in [place].  She endorsed understanding and agreed.  No medication changes made today.  Patient agreeable to medication as prescribed.    1. Bipolar affective disorder, currently manic, severe, with psychotic features (HCC)  Continue- divalproex (DEPAKOTE ER) 500 MG 24 hr tablet; Take 1 tablet (500 mg total) by mouth daily.  Dispense: 30 tablet; Refill: 3 Continue- hydrOXYzine (ATARAX) 25 MG tablet; Take 1 tablet (25 mg total) by mouth every 8 (eight) hours as needed for anxiety.  Dispense: 90 tablet; Refill: 3 Continue- propranolol (INDERAL) 10 MG tablet; Take 1 tablet (10 mg total) by mouth daily.  Dispense: 30 tablet; Refill: 3 Continue- paliperidone (INVEGA) 3 MG 24 hr tablet; Take 1 tablet (3 mg total) by mouth daily.  Dispense: 30 tablet; Refill: 3  2. PTSD (post-traumatic stress disorder)  Continue- mirtazapine (REMERON) 15 MG tablet; Take 1 tablet (15 mg total) by mouth at bedtime.  Dispense: 30 tablet; Refill: 3 Continue- prazosin (MINIPRESS) 1 MG capsule; Take 1 capsule (1 mg total) by mouth at bedtime.  Dispense: 30 capsule; Refill: 3    Collaboration of Care: Collaboration of Care:  Other provider involved in patient's care [facility] clinic staff  Patient/Guardian was advised Release of Information must be obtained prior to any record release in order to collaborate their care with an outside provider. Patient/Guardian was advised if they have not already done so to contact  the registration department to sign all necessary forms in order for [place] to release information regarding their care.   Consent: Patient/Guardian gives verbal consent for treatment and assignment of benefits for services provided during this visit. Patient/Guardian expressed understanding and agreed to proceed.   Follow up as needed Shanna Cisco, NP 04/15/2023, 11:41 AM
